# Patient Record
Sex: Female | Born: 1955 | Race: White | Hispanic: No | Smoking: Never smoker
Health system: Southern US, Community
[De-identification: ages and names within clinical notes are randomized; demographics above are authoritative.]

## PROBLEM LIST (undated history)

## (undated) DIAGNOSIS — T7840XA Allergy, unspecified, initial encounter: Secondary | ICD-10-CM

## (undated) DIAGNOSIS — M7711 Lateral epicondylitis, right elbow: Secondary | ICD-10-CM

## (undated) DIAGNOSIS — E782 Mixed hyperlipidemia: Secondary | ICD-10-CM

## (undated) DIAGNOSIS — G8929 Other chronic pain: Secondary | ICD-10-CM

## (undated) DIAGNOSIS — M542 Cervicalgia: Secondary | ICD-10-CM

## (undated) DIAGNOSIS — K649 Unspecified hemorrhoids: Secondary | ICD-10-CM

## (undated) DIAGNOSIS — K219 Gastro-esophageal reflux disease without esophagitis: Secondary | ICD-10-CM

## (undated) DIAGNOSIS — F411 Generalized anxiety disorder: Secondary | ICD-10-CM

## (undated) DIAGNOSIS — F32A Depression, unspecified: Secondary | ICD-10-CM

## (undated) DIAGNOSIS — I10 Essential (primary) hypertension: Secondary | ICD-10-CM

## (undated) DIAGNOSIS — M47812 Spondylosis without myelopathy or radiculopathy, cervical region: Secondary | ICD-10-CM

## (undated) DIAGNOSIS — R911 Solitary pulmonary nodule: Secondary | ICD-10-CM

## (undated) HISTORY — DX: Depression, unspecified: F32.A

## (undated) HISTORY — DX: Other chronic pain: G89.29

## (undated) HISTORY — DX: Essential (primary) hypertension: I10

## (undated) HISTORY — DX: Spondylosis without myelopathy or radiculopathy, cervical region: M47.812

## (undated) HISTORY — DX: Allergy, unspecified, initial encounter: T78.40XA

## (undated) HISTORY — PX: HERNIA REPAIR: SHX51

## (undated) HISTORY — DX: Cervicalgia: M54.2

## (undated) HISTORY — DX: Generalized anxiety disorder: F41.1

## (undated) HISTORY — DX: Lateral epicondylitis, right elbow: M77.11

## (undated) HISTORY — DX: Solitary pulmonary nodule: R91.1

## (undated) HISTORY — DX: Gastro-esophageal reflux disease without esophagitis: K21.9

## (undated) HISTORY — DX: Mixed hyperlipidemia: E78.2

## (undated) HISTORY — DX: Unspecified hemorrhoids: K64.9

---
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
## 1979-11-24 HISTORY — PX: KNEE SURGERY: SHX244

## 1995-11-24 HISTORY — PX: TOTAL ABDOMINAL HYSTERECTOMY W/ BILATERAL SALPINGOOPHORECTOMY: SHX83

## 2002-02-06 ENCOUNTER — Encounter: Payer: Self-pay | Admitting: Emergency Medicine

## 2002-02-07 ENCOUNTER — Observation Stay (HOSPITAL_COMMUNITY): Admission: EM | Admit: 2002-02-07 | Discharge: 2002-02-08 | Payer: Self-pay | Admitting: Emergency Medicine

## 2002-05-17 ENCOUNTER — Ambulatory Visit (HOSPITAL_COMMUNITY): Admission: RE | Admit: 2002-05-17 | Discharge: 2002-05-17 | Payer: Self-pay | Admitting: Family Medicine

## 2002-05-17 ENCOUNTER — Encounter: Payer: Self-pay | Admitting: Family Medicine

## 2003-02-09 ENCOUNTER — Encounter: Admission: RE | Admit: 2003-02-09 | Discharge: 2003-02-09 | Payer: Self-pay | Admitting: Family Medicine

## 2003-02-09 ENCOUNTER — Encounter: Payer: Self-pay | Admitting: Family Medicine

## 2003-06-21 IMAGING — CT CT CHEST W/O CM
1 of 2 series · 14 of 30 positions shown, 18 images · non-contrast
Comparison: none

FINDINGS
CLINICAL DATA: FOLLOW-UP RIGHT UPPER LOBE NODULE.
CT CHEST WITHOUT CONTRAST - 02/09/03
5 MM SPIRAL IMAGES WERE OBTAINED THROUGH THE CHEST WITHOUT CONTRAST.
WITHIN THE MEDIASTINUM, THERE ARE NO ABNORMALLY ENLARGED LYMPH NODES.  THE VASCULATURE IS GROSSLY
WITHIN NORMAL LIMITS.  THE PERIPHERAL PLEURAL NODULE IS AGAIN NOTED IN THE RIGHT UPPER LOBE ON
IMAGE 18 AND IS NOT CHANGED.  THE SMALL NODULE IN THE RIGHT LOWER LOBE ADJACENT TO THE MAJOR
FISSURE IS UNCHANGED AND SEEN ON IMAGE 24 AND 25.  NO NEW NODULES ARE IDENTIFIED.
NO PNEUMOTHORACES OR EFFUSIONS ARE SEEN.
IMPRESSION
STABLE NODULES IN THE RIGHT LUNG, AS DESCRIBED.  FOLLOW-UP IN SIX MONTHS IS RECOMMENDED TO INSURE
STABILITY.

[Series 2: routine chest · axial · 0.54mm/px · z∈[-237,-7]mm · 14 of 54 slices shown, 18 images]
[im 4/54  mediastinal]
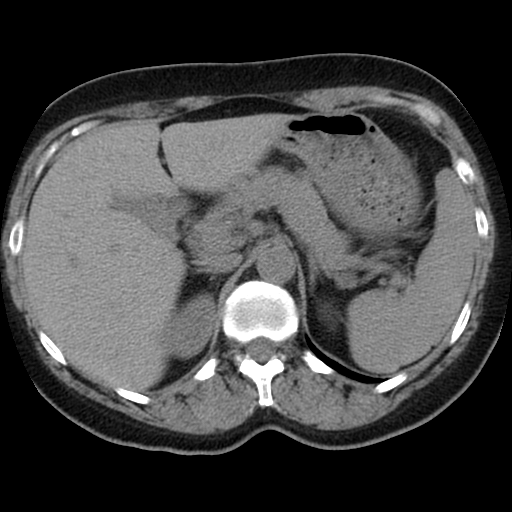
[im 4/54  lung]
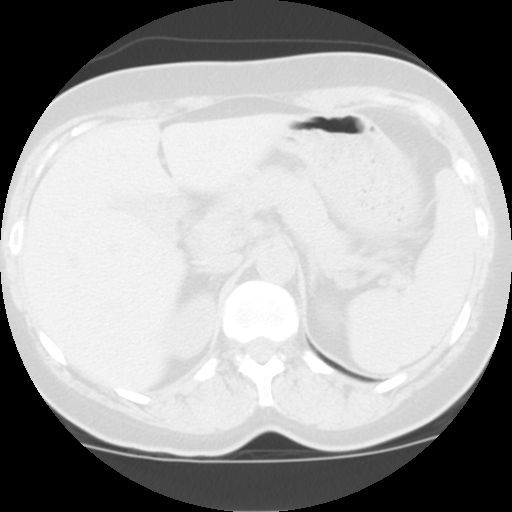
[im 8/54  lung]
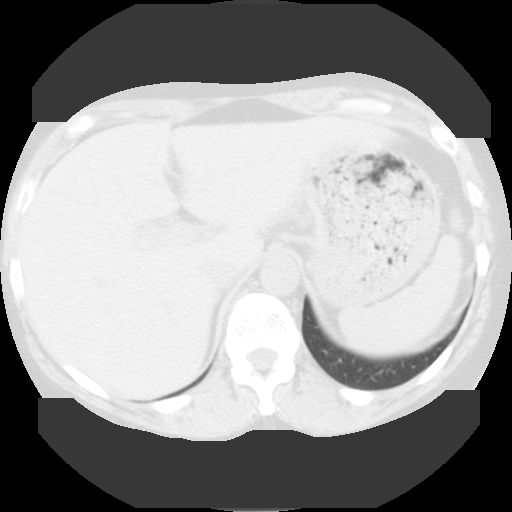
[im 12/54  lung]
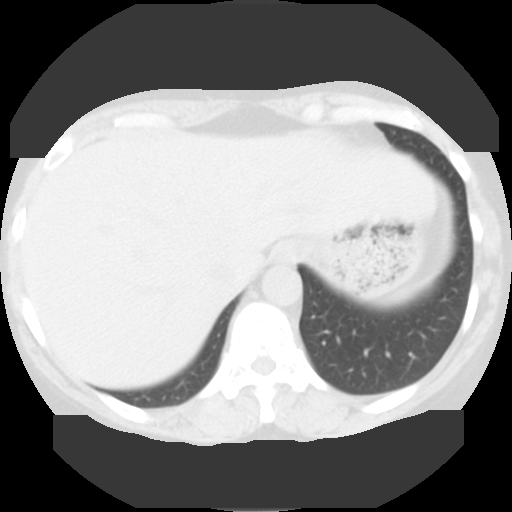
[im 16/54  lung]
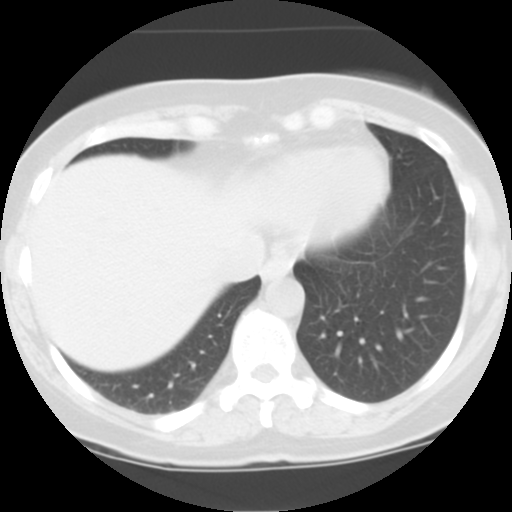
[im 19/54  mediastinal]
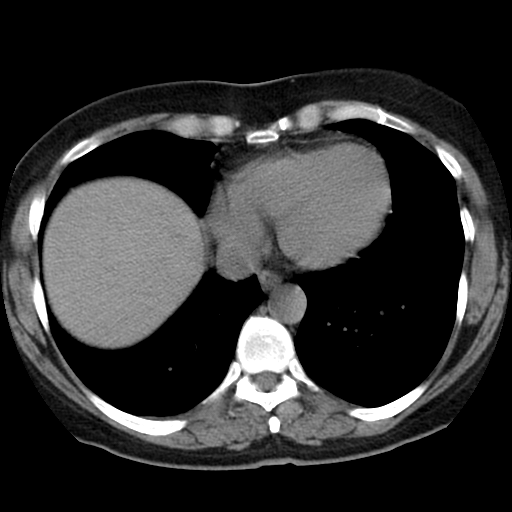
[im 19/54  lung]
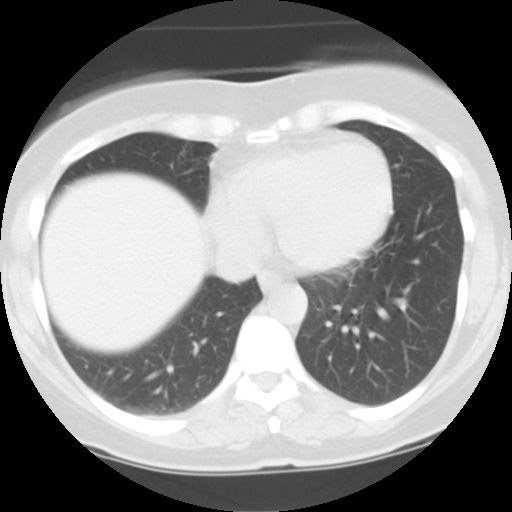
[im 23/54  lung]
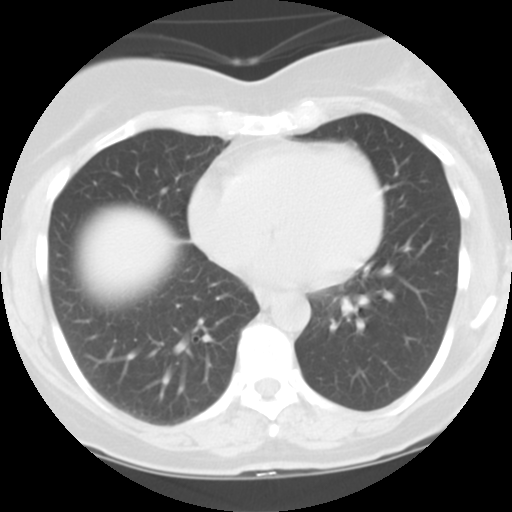
[im 26/54  lung]
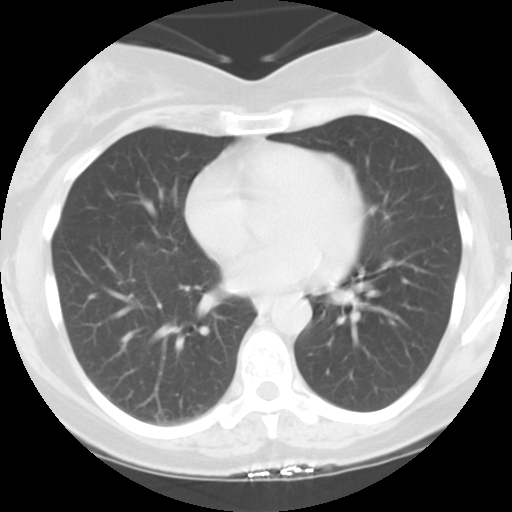
[im 27/54  lung]
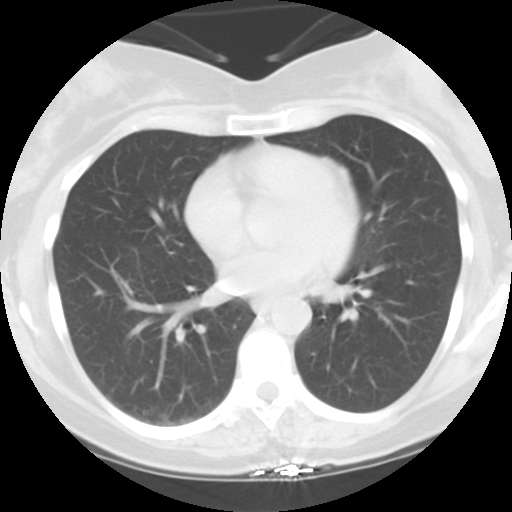
[im 31/54  mediastinal]
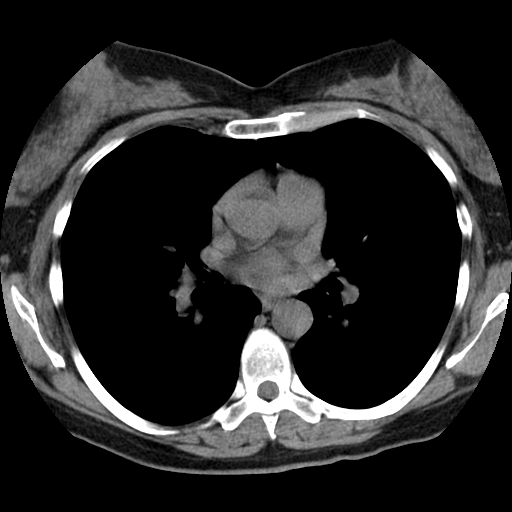
[im 31/54  lung]
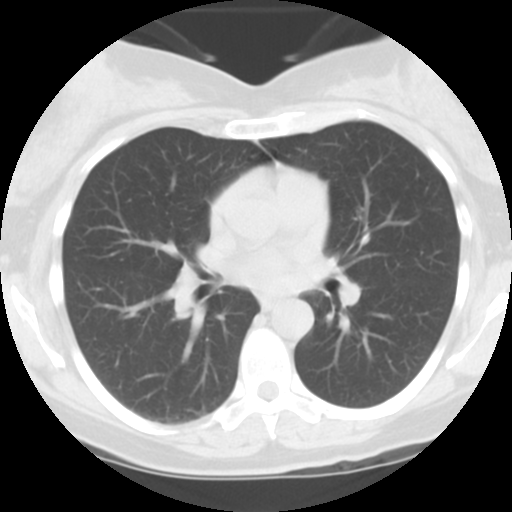
[im 35/54  lung]
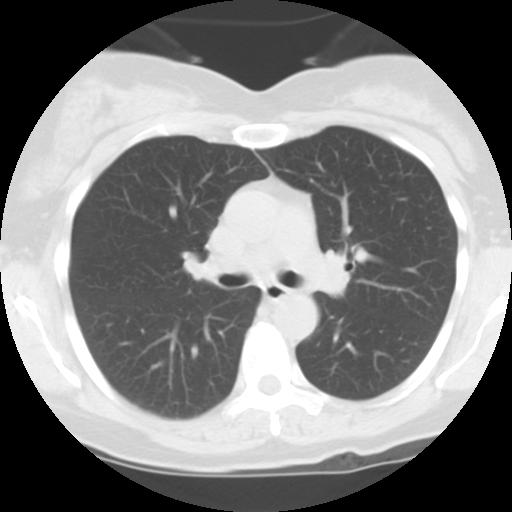
[im 38/54  lung]
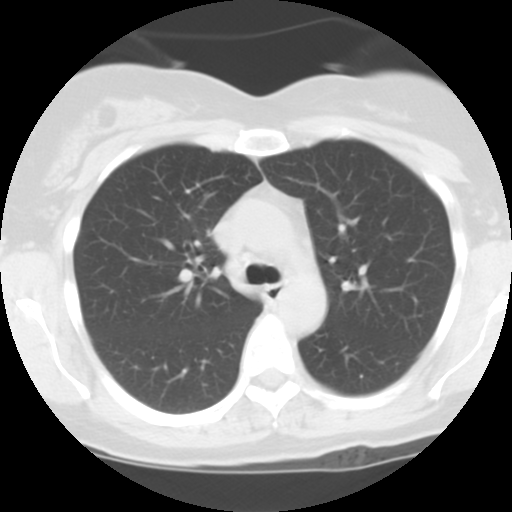
[im 42/54  lung]
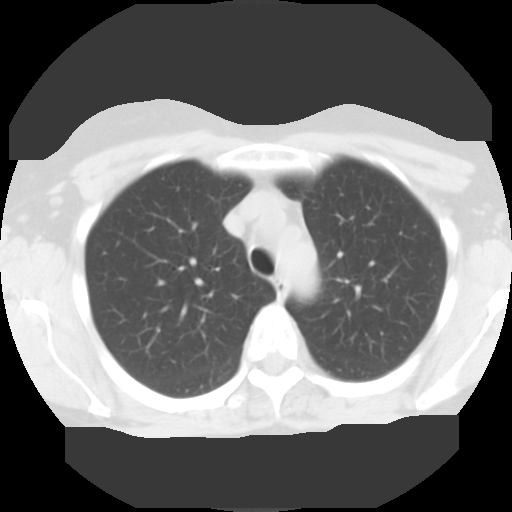
[im 46/54  mediastinal]
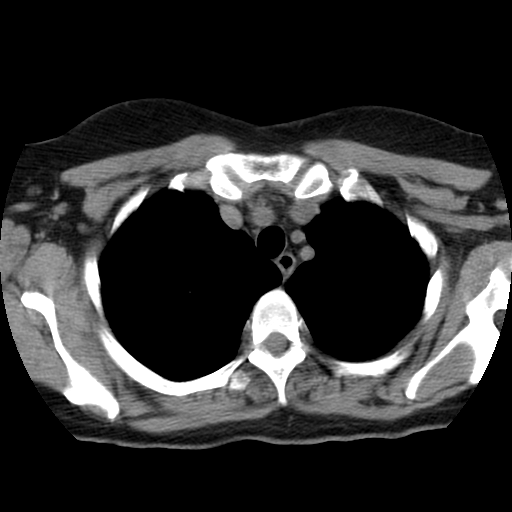
[im 46/54  lung]
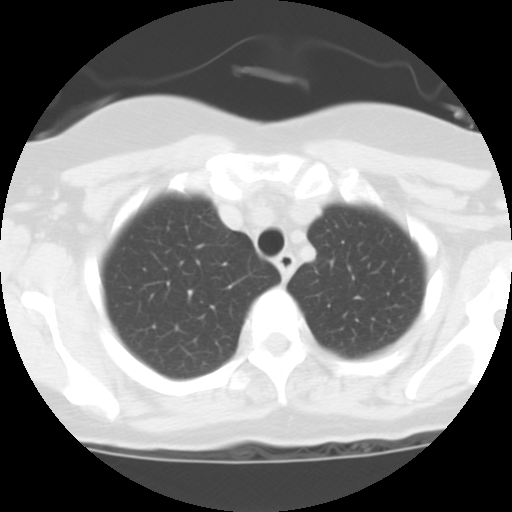
[im 50/54  lung]
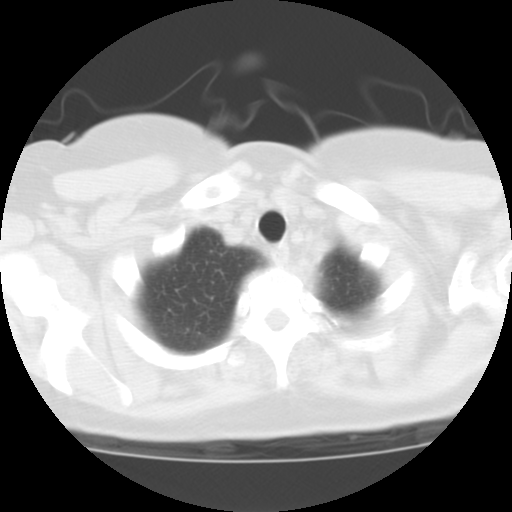

[14 of 30 positions shown; findings below may reference images not displayed]

## 2006-02-17 ENCOUNTER — Ambulatory Visit (HOSPITAL_COMMUNITY)
Admission: RE | Admit: 2006-02-17 | Discharge: 2006-02-17 | Payer: No Typology Code available for payment source | Admitting: Family Medicine

## 2007-02-24 ENCOUNTER — Emergency Department (HOSPITAL_COMMUNITY): Admission: EM | Admit: 2007-02-24 | Discharge: 2007-02-25 | Payer: Self-pay | Admitting: Emergency Medicine

## 2007-03-01 ENCOUNTER — Encounter: Admission: RE | Admit: 2007-03-01 | Discharge: 2007-03-01 | Payer: Self-pay | Admitting: Family Medicine

## 2007-11-24 HISTORY — PX: COLONOSCOPY: SHX174

## 2007-12-25 DIAGNOSIS — M47812 Spondylosis without myelopathy or radiculopathy, cervical region: Secondary | ICD-10-CM

## 2007-12-25 DIAGNOSIS — M7711 Lateral epicondylitis, right elbow: Secondary | ICD-10-CM

## 2007-12-25 HISTORY — DX: Spondylosis without myelopathy or radiculopathy, cervical region: M47.812

## 2007-12-25 HISTORY — DX: Lateral epicondylitis, right elbow: M77.11

## 2008-11-23 DIAGNOSIS — E782 Mixed hyperlipidemia: Secondary | ICD-10-CM

## 2008-11-23 HISTORY — DX: Mixed hyperlipidemia: E78.2

## 2009-11-23 DIAGNOSIS — F411 Generalized anxiety disorder: Secondary | ICD-10-CM

## 2009-11-23 HISTORY — DX: Generalized anxiety disorder: F41.1

## 2009-12-09 ENCOUNTER — Encounter
Admission: RE | Admit: 2009-12-09 | Discharge: 2009-12-09 | Payer: Self-pay | Admitting: Physical Medicine & Rehabilitation

## 2010-01-06 ENCOUNTER — Emergency Department (HOSPITAL_COMMUNITY): Admission: EM | Admit: 2010-01-06 | Discharge: 2010-01-07 | Payer: Self-pay | Admitting: Emergency Medicine

## 2010-12-14 ENCOUNTER — Encounter: Payer: Self-pay | Admitting: Family Medicine

## 2011-02-11 LAB — DIFFERENTIAL
Basophils Absolute: 0 10*3/uL (ref 0.0–0.1)
Basophils Relative: 0 % (ref 0–1)
Eosinophils Absolute: 0.1 10*3/uL (ref 0.0–0.7)
Eosinophils Relative: 1 % (ref 0–5)
Lymphocytes Relative: 16 % (ref 12–46)
Lymphs Abs: 1.4 10*3/uL (ref 0.7–4.0)
Monocytes Absolute: 0.5 10*3/uL (ref 0.1–1.0)
Neutrophils Relative %: 78 % — ABNORMAL HIGH (ref 43–77)

## 2011-02-11 LAB — CBC: MCHC: 34.1 g/dL (ref 30.0–36.0)

## 2011-02-11 LAB — BASIC METABOLIC PANEL
CO2: 26 mEq/L (ref 19–32)
Calcium: 9.1 mg/dL (ref 8.4–10.5)
GFR calc non Af Amer: >60 mL/min (ref >60)
Glucose, Bld: 142 mg/dL — ABNORMAL HIGH (ref 70–99)
Sodium: 135 mEq/L (ref 135–145)

## 2011-02-11 LAB — POCT CARDIAC MARKERS: CKMB, poc: <1 ng/mL — ABNORMAL LOW (ref 1.0–8.0)

## 2011-04-10 NOTE — Consult Note (Signed)
[place]  Patient:    Annette Paul Visit Number: [id number] MRN: [id number]          Service Type: MED Location: [phone] Attending Physician:  Anastasio Auerbach Dictated by:   Meade Maw, M.D. Admit Date:  02/06/2002   CC:         Anastasio Auerbach, M.D.  Marinda Elk, M.D.   Consultation Report  REFERRING PHYSICIAN:  Anastasio Auerbach, M.D.  INDICATION FOR CONSULTATION:  Chest pain.  HISTORY:  Annette Paul is a 55 year old female, who presented to the hospital on February 07, 2002 with complaints of ongoing severe chest pain, described as a 10/10 upon admission.  Currently described as a 2/10.  The pain was initially described as sharp radiating to her neck and was associated with mild nausea and diaphoresis.  She had a similar episode of chest pain approximately one month prior.  This was evaluated by her family physician and was felt to be secondary to anxiety.  Her coronary risk factors include postmenopausal status, family history - grandmother passed at 59 from myocardial infarction. Her cholesterol status is unknown.  There is no history of hypertension or diabetes.  PAST MEDICAL HISTORY: 1. Significant for anxiety. 2. Mitral valve prolapse diagnosed at age 16 by Dr. Patty Sermons with a 2-D    echocardiogram. 3. Chronic cough  PAST SURGICAL HISTORY: 1. Significant for hysterectomy with bilateral salpingo-oophorectomy in 1997. 2. Right knee surgery in 1980. 3. Hernia repair.  ALLERGIES:  SULFA, which results in a rash.  CURRENT MEDICATIONS: 1. Protonix 40 mg daily. 2. Aspirin 81 mg daily. 3. Xanax 0.25 p.o. q.8 p.r.n. for anxiety.  FAMILY HISTORY:  As noted above.  Significant for grandmother passing at age 39 with myocardial infarction.  Two aunts and uncles with CVA.  SOCIAL HISTORY:  She is married.  She has been married for 11 years.  She has a 6-year-old son, who has juvenile onset diabetes.  This creates significant stressors for  her.  She also describes work-related stress.  She states, that her marriage is good.  REVIEW OF SYSTEMS:  She notes frequent headaches, and weight gain.  She has had a dry cough for several months.  No change in bowel or bladder habits.  No blood noted in the stools.  Occasional heart racing and significant anxiety.  PHYSICAL EXAMINATION:  GENERAL:  A middle-aged female, somewhat stressed.  VITALS:  Blood pressure is 120/62, heart rate is 74.  She is afebrile.  Her O2 saturation is 97% on room air.  HEENT:  Remarkable.  She has good carotid upstrokes, no carotid bruits. Thyroid is not palpable.   No neck vein distension.  PULMONARY EXAMINATION:  Revealed breath sounds, which initially has expiratory rhonchi, but clears with coughing.  No use of accessory muscles.  CARDIOVASCULAR EXAMINATION:  Revealed a normal S1, normal S2.  There is a mid systolic click noted.  There is no murmur noted on physical examination, however, the environmental noise is loud.  Her PMI is within normal limits.  ABDOMEN:  Soft.  There is mild tenderness to deep palpations.  There is no unusual bruits or pulsations noted.  EXTREMITIES:  Revealed no peripheral edema.  Her distal pulses are equal and 2+.  Motor is 5/5 throughout.  NEURO:  Nonfocal.  LABORATORY DATA:  Her ECG revealed a normal sinus rhythm, rate of 74, normal ECG.  Her serial cardiac enzymes are negative with a CK of 55, troponin I is 0.03.  Amylase and lipase are within normal limits.  IMPRESSION: 1. CHEST PAIN WITH ATYPICAL AND TYPICAL FEATURES.  She has low-risk index as    her cardiac enzymes and initial ECG are normal.  We will schedule for a    stress Cardiolite for further evaluation.  Differential diagnosis to    include costal chondritis, pain associated with mitral valve prolapse, and    pulmonary etiology.   Currently, her CT scan of the chest is pending. 2. MITRAL VALVE PROLAPSE.  There is a mid systolic click heard on  examination,    but no evidence of a murmur.  As she has been instructed by Dr. Patty Sermons    in the past on bacterial endocarditis prophylaxis, would continue with this    mode of treatment.  Agree with your current treatment of aspirin and as she    has low-risk indicators would not initiate anticoagulants at this time. 3. GERD.  Agree with continuation of her PPIs. 4. HYPOKALEMIA.  Potassium noted to be 3.3 upon admission.  This has been    scheduled to be repeated.  Further recommendations pending the outcome of    her stress Cardiolite.  Thank you for allowing me to participate in the care of your patient.  I will discuss the patient further with you. Dictated by:   Meade Maw, M.D. Attending Physician:  Anastasio Auerbach DD:  02/07/02 TD:  02/08/02 Job: [id number]

## 2011-07-09 ENCOUNTER — Emergency Department (HOSPITAL_COMMUNITY)
Admission: EM | Admit: 2011-07-09 | Discharge: 2011-07-09 | Disposition: A | Discharge status: Home / Self Care | Payer: PRIVATE HEALTH INSURANCE | Attending: Emergency Medicine | Admitting: Emergency Medicine

## 2011-07-09 ENCOUNTER — Emergency Department (HOSPITAL_COMMUNITY): Payer: PRIVATE HEALTH INSURANCE

## 2011-07-09 DIAGNOSIS — R1013 Epigastric pain: Secondary | ICD-10-CM | POA: Insufficient documentation

## 2011-07-09 DIAGNOSIS — R112 Nausea with vomiting, unspecified: Secondary | ICD-10-CM | POA: Insufficient documentation

## 2011-07-09 DIAGNOSIS — K3189 Other diseases of stomach and duodenum: Secondary | ICD-10-CM | POA: Insufficient documentation

## 2011-07-09 DIAGNOSIS — R079 Chest pain, unspecified: Secondary | ICD-10-CM | POA: Insufficient documentation

## 2011-07-09 DIAGNOSIS — R0609 Other forms of dyspnea: Secondary | ICD-10-CM | POA: Insufficient documentation

## 2011-07-09 DIAGNOSIS — R0989 Other specified symptoms and signs involving the circulatory and respiratory systems: Secondary | ICD-10-CM | POA: Insufficient documentation

## 2011-07-09 DIAGNOSIS — I059 Rheumatic mitral valve disease, unspecified: Secondary | ICD-10-CM | POA: Insufficient documentation

## 2011-07-09 LAB — CBC
MCHC: 34 g/dL (ref 30.0–36.0)
MCV: 89.1 fL (ref 78.0–100.0)
Platelets: 297 10*3/uL (ref 150–400)
WBC: 5.4 10*3/uL (ref 4.0–10.5)

## 2011-07-09 LAB — POCT I-STAT TROPONIN I: Troponin i, poc: 0 ng/mL (ref 0.00–0.08)

## 2011-07-09 LAB — POCT I-STAT, CHEM 8
Chloride: 106 mEq/L (ref 96–112)
Potassium: 3.9 mEq/L (ref 3.5–5.1)
Sodium: 140 mEq/L (ref 135–145)
TCO2: 27 mmol/L (ref 0–100)

## 2011-07-09 LAB — DIFFERENTIAL
Basophils Absolute: 0 10*3/uL (ref 0.0–0.1)
Eosinophils Absolute: 0.1 10*3/uL (ref 0.0–0.7)
Lymphs Abs: 1.6 10*3/uL (ref 0.7–4.0)
Monocytes Relative: 7 % (ref 3–12)
Neutro Abs: 3.2 10*3/uL (ref 1.7–7.7)

## 2011-09-14 ENCOUNTER — Encounter: Payer: Self-pay | Admitting: Family Medicine

## 2011-09-14 ENCOUNTER — Other Ambulatory Visit: Payer: Self-pay | Admitting: Family Medicine

## 2011-09-14 ENCOUNTER — Telehealth: Payer: Self-pay | Admitting: Family Medicine

## 2011-09-14 ENCOUNTER — Ambulatory Visit (INDEPENDENT_AMBULATORY_CARE_PROVIDER_SITE_OTHER)
Admission: RE | Admit: 2011-09-14 | Discharge: 2011-09-14 | Disposition: A | Discharge status: Home / Self Care | Payer: PRIVATE HEALTH INSURANCE | Source: Ambulatory Visit | Attending: Family Medicine | Admitting: Family Medicine

## 2011-09-14 ENCOUNTER — Ambulatory Visit (INDEPENDENT_AMBULATORY_CARE_PROVIDER_SITE_OTHER): Payer: PRIVATE HEALTH INSURANCE | Admitting: Family Medicine

## 2011-09-14 ENCOUNTER — Ambulatory Visit (HOSPITAL_BASED_OUTPATIENT_CLINIC_OR_DEPARTMENT_OTHER)
Admission: RE | Admit: 2011-09-14 | Discharge: 2011-09-14 | Disposition: A | Discharge status: Home / Self Care | Payer: PRIVATE HEALTH INSURANCE | Source: Ambulatory Visit | Attending: Family Medicine | Admitting: Family Medicine

## 2011-09-14 DIAGNOSIS — M79609 Pain in unspecified limb: Secondary | ICD-10-CM

## 2011-09-14 DIAGNOSIS — Z1231 Encounter for screening mammogram for malignant neoplasm of breast: Secondary | ICD-10-CM

## 2011-09-14 DIAGNOSIS — Z1239 Encounter for other screening for malignant neoplasm of breast: Secondary | ICD-10-CM

## 2011-09-14 DIAGNOSIS — M79644 Pain in right finger(s): Secondary | ICD-10-CM

## 2011-09-14 DIAGNOSIS — M542 Cervicalgia: Secondary | ICD-10-CM

## 2011-09-14 DIAGNOSIS — G8929 Other chronic pain: Secondary | ICD-10-CM

## 2011-09-14 DIAGNOSIS — M19049 Primary osteoarthritis, unspecified hand: Secondary | ICD-10-CM | POA: Insufficient documentation

## 2011-09-14 DIAGNOSIS — M25519 Pain in unspecified shoulder: Secondary | ICD-10-CM

## 2011-09-14 DIAGNOSIS — J069 Acute upper respiratory infection, unspecified: Secondary | ICD-10-CM

## 2011-09-14 MED ORDER — CARISOPRODOL 250 MG PO TABS: ORAL_TABLET | ORAL | Status: DC

## 2011-09-14 MED ORDER — HYDROCODONE-ACETAMINOPHEN 5-500 MG PO TABS: 1.0000 | ORAL_TABLET | Freq: Four times a day (QID) | ORAL | Status: AC | PRN
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
## 2011-09-14 NOTE — Telephone Encounter (Signed)
I forgot to attach a result note to her x-rays today, so I'm sending you a callback as a telephone note: please call pt and tell her that her x-rays of her  Hand showed only mild degenerative arthritis in her finger and her neck showed no significant abnormality.  This is good. Continue with plan as we outlined today at her office visit: pain control with vicodin and soma, NSAID like ibuprofen or alleve as tolerated like she has been doing, and I will do an injection of her finger when she returns for f/u in 2 wks. She can schedule this injection with me earlier if she wants to.  --PM

## 2011-09-14 NOTE — Telephone Encounter (Signed)
Pls request records from [facility] at OR.  thx-PM.

## 2011-09-14 NOTE — Telephone Encounter (Signed)
Done

## 2011-09-14 NOTE — Assessment & Plan Note (Signed)
Suspect DDD with spinal nerve root irritation/compression on left. Will check C-spine plain films, obtain old records, continue bid alleve or ibuprofen, refer to PT. Vicodin 5/500 and soma 250mg  rx'd today for prn use for comfort. Therapeutic expectations and side effect profile of medication discussed today.  Patient's questions answered. If no improvement with conservative mgmt, will proceed with c-spine MRI +/- neurosurgery referral.

## 2011-09-14 NOTE — Assessment & Plan Note (Signed)
Trigger finger, 3rd digit on right hand.  Possibly some osteoarthritis as well. Will check plain film of right hand, continue bid NSAID as she is doing. At recheck in 2 wks we can offer steroid injection of this.

## 2011-09-14 NOTE — Progress Notes (Signed)
Addended by: Andrew Au on: 09/14/2011 03:58 PM   Modules accepted: Orders

## 2011-09-14 NOTE — Assessment & Plan Note (Signed)
Discussed symptomatic care. Suggested mucinex dm prn.

## 2011-09-16 ENCOUNTER — Other Ambulatory Visit: Payer: Self-pay | Admitting: Family Medicine

## 2011-09-16 MED ORDER — FLUOXETINE HCL 20 MG PO CAPS: 20.0000 mg | ORAL_CAPSULE | Freq: Every day | ORAL | Status: DC

## 2011-09-16 NOTE — Telephone Encounter (Signed)
I have attempted to contact this patient by phone with the following results: left message to return my call on answering machine (home/mobile).  

## 2011-09-16 NOTE — Telephone Encounter (Signed)
RC to pt.  She has appt for injection tomorrow.  Notified of xray results.  She is agreeable.  She states that Annette Paul was too expensive and she did not pick up that med.  She would like something cheaper.  Advised pt Dr. Milinda Cave out of office this PM, but he will get this message tomorrow.  She is agreeable.

## 2011-09-16 NOTE — Telephone Encounter (Signed)
Pt would like you to RX her fluoxetine.  She has been out for awhile.  She states this was discussed at her visit.  Is this OK?  I will notify patient.  Thanks.

## 2011-09-16 NOTE — Telephone Encounter (Signed)
Yes.  I sent eRx for fluoxetine just now--PM

## 2011-09-17 ENCOUNTER — Encounter: Payer: Self-pay | Admitting: Family Medicine

## 2011-09-17 ENCOUNTER — Ambulatory Visit (INDEPENDENT_AMBULATORY_CARE_PROVIDER_SITE_OTHER): Payer: PRIVATE HEALTH INSURANCE | Admitting: Family Medicine

## 2011-09-17 VITALS — BP 136/87 | HR 72 | Temp 98.6°F | Wt 150.0 lb

## 2011-09-17 DIAGNOSIS — M79609 Pain in unspecified limb: Secondary | ICD-10-CM

## 2011-09-17 DIAGNOSIS — M79644 Pain in right finger(s): Secondary | ICD-10-CM

## 2011-09-17 MED ORDER — CARISOPRODOL 350 MG PO TABS: 350.0000 mg | ORAL_TABLET | Freq: Four times a day (QID) | ORAL | Status: DC | PRN

## 2011-09-17 NOTE — Assessment & Plan Note (Addendum)
Trigger finger, right middle finger. Discussed options, discussed pros/cons of injection.  Pt wished to proceed with tendonous sheath injection today. Injected 1/4 cc 80mg /cc depomedrol + 1/2 cc of 1% lidocaine w/out epi today without problem.   Location of injection was 1/2 cm before the proximal volar crease on the 3rd digit of right hand.  Inserted needle perpendicular to skin and pushed slowly to the point of abutting the tendon (about 3/8").  I then pulled back about 1mm and injected.  No resistance to injection.   Pt tolerated procedure well, no immediate complications. Buddy tape x 3d, then minimal flexion/extension of the digit for 3 additional wks.

## 2011-09-17 NOTE — Progress Notes (Signed)
OFFICE NOTE  09/17/2011  CC:  Chief Complaint  Patient presents with  . Injections    joint injection in finger     HPI: Patient is a 55 y.o. Caucasian female who is here for injection of trigger finger on right hand.  Has had > 68mo of middle finger pain focused mainly at MCP, prox phalanx, and PIP areas on volar aspect, particularly when squeezed or when she flexes.  Stiff/poor tracking, some rare sticking in flexed position.  X-ray of right hand a few days ago was unremarkable.  Pertinent PMH:  Chronic neck pain Anxiety/depression  Pertinent Meds: Fluoxetine 20mg  qd, vicodin 5/500 prn, ibuprofen/alleve prn  PE: Blood pressure 136/87, pulse 72, temperature 98.6 F (37 C), temperature source Oral, weight 150 lb (68.04 kg), SpO2 94.00%. Right hand without swelling, erythema, or warmth.  Mild TTP on volar aspect of 3rd digit from metacarpal head region into PIP region.  ROM limited in flexion secondary to pain and stiffness.    IMPRESSION AND PLAN:  Finger pain, right Trigger finger, right middle finger. Discussed options, discussed pros/cons of injection.  Pt wished to proceed with tendonous sheath injection today. Injected 1/4 cc 80mg /cc depomedrol + 1/2 cc of 1% lidocaine w/out epi today without problem.   Location of injection was 1/2 cm before the proximal volar crease on the 3rd digit of right hand.  Inserted needle perpendicular to skin and pushed slowly to the point of abutting the tendon (about 3/8").  I then pulled back about 1mm and injected.  No resistance to injection.   Pt tolerated procedure well, no immediate complications. Buddy tape x 3d, then minimal flexion/extension of the digit for 3 additional wks.      FOLLOW UP: prn (she has f/u appt already set).

## 2011-09-24 ENCOUNTER — Ambulatory Visit: Payer: PRIVATE HEALTH INSURANCE | Admitting: Family Medicine

## 2011-10-01 ENCOUNTER — Encounter: Payer: Self-pay | Admitting: Family Medicine

## 2011-10-02 ENCOUNTER — Ambulatory Visit: Payer: PRIVATE HEALTH INSURANCE | Admitting: Family Medicine

## 2011-11-18 ENCOUNTER — Ambulatory Visit (HOSPITAL_COMMUNITY): Payer: PRIVATE HEALTH INSURANCE | Attending: Family Medicine

## 2011-12-07 ENCOUNTER — Encounter (HOSPITAL_BASED_OUTPATIENT_CLINIC_OR_DEPARTMENT_OTHER): Payer: Self-pay | Admitting: *Deleted

## 2011-12-07 ENCOUNTER — Emergency Department (INDEPENDENT_AMBULATORY_CARE_PROVIDER_SITE_OTHER): Payer: PRIVATE HEALTH INSURANCE

## 2011-12-07 ENCOUNTER — Emergency Department (HOSPITAL_BASED_OUTPATIENT_CLINIC_OR_DEPARTMENT_OTHER)
Admission: EM | Admit: 2011-12-07 | Discharge: 2011-12-07 | Disposition: A | Discharge status: Home / Self Care | Payer: PRIVATE HEALTH INSURANCE | Attending: Emergency Medicine | Admitting: Emergency Medicine

## 2011-12-07 DIAGNOSIS — J9819 Other pulmonary collapse: Secondary | ICD-10-CM

## 2011-12-07 DIAGNOSIS — J45909 Unspecified asthma, uncomplicated: Secondary | ICD-10-CM | POA: Insufficient documentation

## 2011-12-07 DIAGNOSIS — Z79899 Other long term (current) drug therapy: Secondary | ICD-10-CM | POA: Insufficient documentation

## 2011-12-07 DIAGNOSIS — J40 Bronchitis, not specified as acute or chronic: Secondary | ICD-10-CM | POA: Insufficient documentation

## 2011-12-07 DIAGNOSIS — R0602 Shortness of breath: Secondary | ICD-10-CM | POA: Insufficient documentation

## 2011-12-07 DIAGNOSIS — K219 Gastro-esophageal reflux disease without esophagitis: Secondary | ICD-10-CM | POA: Insufficient documentation

## 2011-12-07 DIAGNOSIS — E785 Hyperlipidemia, unspecified: Secondary | ICD-10-CM | POA: Insufficient documentation

## 2011-12-07 DIAGNOSIS — R0989 Other specified symptoms and signs involving the circulatory and respiratory systems: Secondary | ICD-10-CM

## 2011-12-07 DIAGNOSIS — R05 Cough: Secondary | ICD-10-CM

## 2011-12-07 DIAGNOSIS — I10 Essential (primary) hypertension: Secondary | ICD-10-CM | POA: Insufficient documentation

## 2011-12-07 DIAGNOSIS — G8929 Other chronic pain: Secondary | ICD-10-CM | POA: Insufficient documentation

## 2011-12-07 DIAGNOSIS — R059 Cough, unspecified: Secondary | ICD-10-CM

## 2011-12-07 MED ORDER — ALBUTEROL SULFATE HFA 108 (90 BASE) MCG/ACT IN AERS
2.0000 | INHALATION_SPRAY | Freq: Once | RESPIRATORY_TRACT | Status: AC
  Administered 2011-12-07: 2 via RESPIRATORY_TRACT

## 2011-12-07 MED ORDER — BENZONATATE 100 MG PO CAPS
200.0000 mg | ORAL_CAPSULE | Freq: Once | ORAL | Status: AC
  Administered 2011-12-07: 200 mg via ORAL
  Filled 2011-12-07: qty 2

## 2011-12-07 MED ORDER — METHYLPREDNISOLONE SODIUM SUCC 125 MG IJ SOLR
INTRAMUSCULAR | Status: AC
  Filled 2011-12-07: qty 2

## 2011-12-07 MED ORDER — AEROCHAMBER PLUS W/MASK LARGE MISC
1.0000 | Freq: Once | Status: AC
  Administered 2011-12-07: 1
  Filled 2011-12-07: qty 1

## 2011-12-07 MED ORDER — ALBUTEROL SULFATE (5 MG/ML) 0.5% IN NEBU
INHALATION_SOLUTION | RESPIRATORY_TRACT | Status: AC
  Administered 2011-12-07: 21:00:00
  Filled 2011-12-07: qty 2

## 2011-12-07 MED ORDER — PREDNISONE 50 MG PO TABS: 60.0000 mg | ORAL_TABLET | Freq: Once | ORAL | Status: DC

## 2011-12-07 MED ORDER — IPRATROPIUM BROMIDE 0.02 % IN SOLN
RESPIRATORY_TRACT | Status: AC
  Administered 2011-12-07: 21:00:00
  Filled 2011-12-07: qty 2.5

## 2011-12-07 MED ORDER — HYDROCOD POLST-CHLORPHEN POLST 10-8 MG/5ML PO LQCR: 5.0000 mL | Freq: Every evening | ORAL | Status: DC | PRN

## 2011-12-07 MED ORDER — BENZONATATE 200 MG PO CAPS: 200.0000 mg | ORAL_CAPSULE | Freq: Three times a day (TID) | ORAL | Status: AC | PRN

## 2011-12-07 MED ORDER — ALBUTEROL SULFATE HFA 108 (90 BASE) MCG/ACT IN AERS
INHALATION_SPRAY | RESPIRATORY_TRACT | Status: AC
  Administered 2011-12-07: 2 via RESPIRATORY_TRACT
  Filled 2011-12-07: qty 6.7

## 2011-12-07 MED ORDER — PREDNISONE 20 MG PO TABS: 60.0000 mg | ORAL_TABLET | Freq: Every day | ORAL | Status: DC

## 2011-12-07 NOTE — Patient Instructions (Signed)
Instructed pt on the proper use of administering albuteral mdi via aerochamber pt tolerated well 

## 2011-12-07 NOTE — ED Notes (Signed)
Brought in by EMS  For increased SOB with dx bronchitis
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
## 2011-12-07 NOTE — ED Notes (Signed)
Solumedrol given by ems

## 2012-11-30 ENCOUNTER — Ambulatory Visit (INDEPENDENT_AMBULATORY_CARE_PROVIDER_SITE_OTHER): Payer: No Typology Code available for payment source | Admitting: Family Medicine

## 2012-11-30 ENCOUNTER — Encounter: Payer: Self-pay | Admitting: Family Medicine

## 2012-11-30 VITALS — BP 143/90 | HR 81 | Temp 98.6°F | Ht 62.0 in | Wt 146.0 lb

## 2012-11-30 DIAGNOSIS — K649 Unspecified hemorrhoids: Secondary | ICD-10-CM

## 2012-11-30 DIAGNOSIS — K5900 Constipation, unspecified: Secondary | ICD-10-CM

## 2012-11-30 DIAGNOSIS — K5909 Other constipation: Secondary | ICD-10-CM | POA: Insufficient documentation

## 2012-11-30 NOTE — Assessment & Plan Note (Signed)
Recommended she continue her docusate 200mg  qd and add miralax qd as well as metamucil daily.

## 2012-11-30 NOTE — Assessment & Plan Note (Signed)
Chronic, painful, occasionally bleeding hemorrhoids. She is in daily misery and her topical therapies and soaks are not improving things. Will refer to general surgery to see if she is a candidate for hemorrhoid surgery.
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
## 2012-12-01 ENCOUNTER — Telehealth: Payer: Self-pay | Admitting: *Deleted

## 2012-12-01 MED ORDER — FLUOXETINE HCL 20 MG PO CAPS: 20.0000 mg | ORAL_CAPSULE | Freq: Every day | ORAL | Status: DC

## 2012-12-01 NOTE — Telephone Encounter (Signed)
Notified and is agreeable.  She has appt with [facility] tomorrow.

## 2012-12-01 NOTE — Telephone Encounter (Signed)
Since she has been on this med in the past and did well, a routine 6 mo f/u for med check is fine.  She can come in for f/u earlier if she is worsening despite being on the med or if the med doesn't start to help in 4 wks (remind her that it typically takes 3 wks to begin to help with anxiety/mood symptoms).  Thx.

## 2012-12-01 NOTE — Telephone Encounter (Signed)
Was previously taking fluoxetine, but has not taken recently as her RX ran out and she has not been in to see you.  Last OV prior to yesterday was 09/17/2011.  Pt called today requesting refill on fluoxetine.  Is this OK? Does pt need follow up appt? Please advise

## 2012-12-02 ENCOUNTER — Ambulatory Visit (INDEPENDENT_AMBULATORY_CARE_PROVIDER_SITE_OTHER): Payer: No Typology Code available for payment source | Admitting: General Surgery

## 2012-12-02 ENCOUNTER — Encounter (INDEPENDENT_AMBULATORY_CARE_PROVIDER_SITE_OTHER): Payer: Self-pay | Admitting: General Surgery

## 2012-12-02 VITALS — BP 128/80 | HR 76 | Temp 98.2°F | Resp 14 | Ht 62.0 in | Wt 147.8 lb

## 2012-12-02 DIAGNOSIS — K644 Residual hemorrhoidal skin tags: Secondary | ICD-10-CM

## 2012-12-02 DIAGNOSIS — K648 Other hemorrhoids: Secondary | ICD-10-CM | POA: Insufficient documentation

## 2012-12-02 MED ORDER — HYDROCORTISONE 2.5 % RE CREA: TOPICAL_CREAM | Freq: Two times a day (BID) | RECTAL | Status: DC

## 2012-12-02 NOTE — Patient Instructions (Signed)
Hemorrhoids  Hemorrhoids are enlarged (dilated) veins around the rectum. There are 2 types of hemorrhoids, and the type of hemorrhoid is determined by its location. Internal hemorrhoids occur in the veins just inside the rectum.They are usually not painful, but they may bleed.However, they may poke through to the outside and become irritated and painful. External hemorrhoids involve the veins outside the anus and can be felt as a painful swelling or hard lump near the anus.They are often itchy and may crack and bleed. Sometimes clots will form in the veins. This makes them swollen and painful. These are called thrombosed hemorrhoids. CAUSES Causes of hemorrhoids include:  Pregnancy. This increases the pressure in the hemorrhoidal veins.   Constipation.   Straining to have a bowel movement.   Obesity.   Heavy lifting or other activity that caused you to strain.   You have internal and external hemorrhoids.  One of the internal hemorrhoids is prolapsing.   I will add prescription hemorrhoid cream.  This is to decrease inflammation.   Add Miralax daily.    TREATMENT Most of the time hemorrhoids improve in 1 to 2 weeks. However, if symptoms do not seem to be getting better or if you have a lot of rectal bleeding, your caregiver may perform a procedure to help make the hemorrhoids get smaller or remove them completely.Possible treatments include:  Rubber band ligation. A rubber band is placed at the base of the hemorrhoid to cut off the circulation.   Sclerotherapy. A chemical is injected to shrink the hemorrhoid.   Infrared light therapy. Tools are used to burn the hemorrhoid.   Hemorrhoidectomy. This is surgical removal of the hemorrhoid.   HOME CARE INSTRUCTIONS   Increase fiber in your diet. Metamucil 4 x/day   Drink enough water and fluids to keep your urine clear or pale yellow.   Exercise regularly.   Go to the bathroom when you have the urge to have a bowel movement. Do  not wait.   Avoid straining to have bowel movements.   Keep the anal area dry and clean.   Only take over-the-counter or prescription medicines for pain, discomfort, or fever as directed by your caregiver.   Take warm sitz baths for 20 to 30 minutes, 3 to 4 times per day.   If the hemorrhoids are very tender and swollen, place ice packs on the area as tolerated. Using ice packs between sitz baths may be helpful. Fill a plastic bag with ice. Place a towel between the bag of ice and your skin.   Medicated creams and suppositories may be used or applied as directed.   Do not use a donut-shaped pillow or sit on the toilet for long periods. This increases blood pooling and pain.   SEEK MEDICAL CARE IF:   You have increasing pain and swelling that is not controlled with your medicine.   You have uncontrolled bleeding.   You have difficulty or you are unable to have a bowel movement.   You have pain or inflammation outside the area of the hemorrhoids.   You have chills or an oral temperature above 102 F (38.9 C).

## 2012-12-02 NOTE — Assessment & Plan Note (Signed)
Patient has circumferential external and internal hemorrhoids. There is an internal hemorrhoid on the left lateral aspect that is prolapsing and has excoriated mucosa.  I injected the circumferential internal hemorrhoids circumferentially.  I am going to prescribe Anusol HC cream. I also am advising her to take MiraLAX daily. I will see her back in 6-8 weeks. He should continue her stool softener and her sitz baths.
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
## 2013-01-19 ENCOUNTER — Encounter (INDEPENDENT_AMBULATORY_CARE_PROVIDER_SITE_OTHER): Payer: No Typology Code available for payment source | Admitting: General Surgery

## 2013-02-13 ENCOUNTER — Encounter (INDEPENDENT_AMBULATORY_CARE_PROVIDER_SITE_OTHER): Payer: No Typology Code available for payment source | Admitting: General Surgery

## 2013-03-13 ENCOUNTER — Encounter: Payer: Self-pay | Admitting: Family Medicine

## 2013-03-13 ENCOUNTER — Ambulatory Visit (INDEPENDENT_AMBULATORY_CARE_PROVIDER_SITE_OTHER): Payer: No Typology Code available for payment source | Admitting: Family Medicine

## 2013-03-13 VITALS — BP 149/100 | HR 79 | Temp 97.9°F | Resp 16 | Wt 150.5 lb

## 2013-03-13 DIAGNOSIS — M25549 Pain in joints of unspecified hand: Secondary | ICD-10-CM

## 2013-03-13 DIAGNOSIS — M25541 Pain in joints of right hand: Secondary | ICD-10-CM

## 2013-03-13 DIAGNOSIS — R5381 Other malaise: Secondary | ICD-10-CM

## 2013-03-13 DIAGNOSIS — M25542 Pain in joints of left hand: Secondary | ICD-10-CM

## 2013-03-13 DIAGNOSIS — I10 Essential (primary) hypertension: Secondary | ICD-10-CM

## 2013-03-13 DIAGNOSIS — R5383 Other fatigue: Secondary | ICD-10-CM

## 2013-03-13 DIAGNOSIS — M6530 Trigger finger, unspecified finger: Secondary | ICD-10-CM

## 2013-03-13 LAB — COMPREHENSIVE METABOLIC PANEL
ALT: 15 U/L (ref 0–35)
Alkaline Phosphatase: 117 U/L (ref 39–117)
Glucose, Bld: 83 mg/dL (ref 70–99)
Sodium: 137 mEq/L (ref 135–145)
Total Bilirubin: 0.6 mg/dL (ref 0.3–1.2)
Total Protein: 7.9 g/dL (ref 6.0–8.3)

## 2013-03-13 LAB — SEDIMENTATION RATE: Sed Rate: 22 mm/hr (ref 0–22)

## 2013-03-13 LAB — TSH: TSH: 0.41 u[IU]/mL (ref 0.35–5.50)

## 2013-03-13 LAB — CBC WITH DIFFERENTIAL/PLATELET
Basophils Absolute: 0 10*3/uL (ref 0.0–0.1)
Eosinophils Absolute: 0.2 10*3/uL (ref 0.0–0.7)
MCHC: 33 g/dL (ref 30.0–36.0)
MCV: 89.8 fl (ref 78.0–100.0)
Monocytes Absolute: 0.4 10*3/uL (ref 0.1–1.0)
Neutrophils Relative %: 56.5 % (ref 43.0–77.0)
Platelets: 302 10*3/uL (ref 150.0–400.0)
RDW: 12.9 % (ref 11.5–14.6)

## 2013-03-13 LAB — RHEUMATOID FACTOR: Rhuematoid fact SerPl-aCnc: <10 IU/mL (ref <=14)

## 2013-03-13 NOTE — Progress Notes (Signed)
OFFICE NOTE  03/13/2013  CC:  Chief Complaint  Patient presents with  . Hand Pain    Pt c/o pain & swelling in hands, bilateral affecting ROM x1 mth.  . Hypertension    Pt c/o excessive fatigue w/some lightheadedness     HPI: Patient is a 57 y.o. Caucasian female who is here for finger pain. Describes daily pain in both middle fingers, getting stuck in flexion some lately.  Alleve and ibuprofen no help. She buddy taped the left middle finger one night recently and it helped some briefly. 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  Works at [employer]. Felt a knot at MCP jt area of 3rd digit on left hand recently.   On further questioning she says that all her finger joints hurt somewhat, but the middle fingers are the worst.  Worse as the day goes on.  When asked about other joint pain or muscle pain, she responds " I hurt just about everywhere at some point in time"--lots of aches and pains on and off but no joint swelling or redness.  For the last month has felt fatigue.  Working full time.  Quite stressed lately.  Some excessive sleepiness during day lately.  She is not sure if she snores.  She has awoken in distant past from sleep feeling like she was suffocating but not in " a good long while". Sleep initiation and maintenance are poor lately.  Mood reported as OK, down a little b/c of her recent hand pain and fatigue issues. BP checks at home: some checks 120s/70s, some systolics and diastolics in stage 1 HTN range.   No OTC decongestant lately.  Caffeine intake: 2 cups coffee daily.  No vag bleeding.  Still rare bleeding from rectal area (hx of hemorrhoids): has f/u with surgeon 03/2013.    Pertinent PMH:  Past Medical History  Diagnosis Date  . Chronic neck pain   . GAD (generalized anxiety disorder) 2011    Fluoxetine helping  . GERD (gastroesophageal reflux disease)   . Constipation   . Asthma   . Hypertension    briefly medicated in the past  . Hyperlipidemia, mixed 2010    hs-CRP >9, crestor started  . Pulmonary nodule   . Cervical facet syndrome 12/2007  . Lateral epicondylitis of right elbow 12/2007  . Hemorrhoids     External and internal (prolapsed internal); gen surg injected these 12/02/12.  Flexor tenosynovitis/trigger finger--- injected 2012  MEDS:  Fluoxetine 20mg  qd Outpatient Prescriptions Prior to Visit  Medication Sig Dispense Refill  . docusate sodium (PHILLIPS STOOL SOFTENER) 100 MG capsule Take 200 mg by mouth daily.      . hydrocortisone (ANUSOL-HC) 2.5 % rectal cream Place rectally 2 (two) times daily.  30 g  3  . Multiple Vitamin (MULTIVITAMIN) tablet Take 1 tablet by mouth daily.        . naproxen sodium (ANAPROX) 220 MG tablet Take 220 mg by mouth 2 (two) times daily with a meal.       No facility-administered medications prior to visit.    PE: Blood pressure 149/100, pulse 79, temperature 97.9 F (36.6 C), temperature source Oral, resp. rate 16, weight 150 lb 8 oz (68.266 kg), SpO2 98.00%. Gen: Alert, well appearing.  Patient is oriented to person, place, time, and situation. ENT: Ears: EACs clear, normal epithelium.  TMs with good light reflex and landmarks bilaterally.  Eyes: no injection, icteris, swelling,  or exudate.  EOMI, PERRLA. Nose: no drainage or turbinate edema/swelling.  No injection or focal lesion.  Mouth: lips without lesion/swelling.  Oral mucosa pink and moist.  Dentition intact and without obvious caries or gingival swelling.  Oropharynx without erythema, exudate, or swelling.  CV: RRR, no m/r/g.   LUNGS: CTA bilat, nonlabored resps, good aeration in all lung fields. Hands: both hands have TTP along MCPs and PIPs.  Mild swelling of volar region of 3rd fingers bilat--at the level of the MCP joint, left side with a small nodule palpable in this region.    IMPRESSION AND PLAN:  1) Hand (and other joint?) arthralgias.  Question sign of arthritis but I  think this is primarily flexor tendon sheath inflammation and trigger finger pain. X-ray of right hand in the past pretty unremarkable.   Check ANA, TSH, Rh factor, CRP, ESR, CBC, CMET.  Will order referral to [facility] [facility] for further eval and management of her trigger fingers. 2) HTN: she is pretty psychologically resistant to medication treatment.  Prefers to push TLC efforts, also discussed DASH diet. 3) Fatigue: likely stress induced.  OSA questionnaire low risk in office today.  See labs under problem #1 above.  FOLLOW UP: 3 mo

## 2013-03-13 NOTE — Patient Instructions (Signed)
Continue to monitor blood pressure at home: Goal bp is <130/80.  High BP is >140 on top number or >90 on bottom number. Call or return if numbers are consistently in high range on top OR bottom.  Review DASH diet. EXERCISE MORE !

## 2013-08-21 ENCOUNTER — Encounter (INDEPENDENT_AMBULATORY_CARE_PROVIDER_SITE_OTHER): Payer: Self-pay

## 2014-04-13 DIAGNOSIS — IMO0002 Reserved for concepts with insufficient information to code with codable children: Secondary | ICD-10-CM | POA: Insufficient documentation

## 2014-04-13 DIAGNOSIS — N362 Urethral caruncle: Secondary | ICD-10-CM | POA: Insufficient documentation

## 2015-07-03 ENCOUNTER — Other Ambulatory Visit: Payer: Self-pay | Admitting: Internal Medicine

## 2015-07-03 ENCOUNTER — Other Ambulatory Visit: Payer: Self-pay | Admitting: Nurse Practitioner

## 2015-07-03 DIAGNOSIS — Z1231 Encounter for screening mammogram for malignant neoplasm of breast: Secondary | ICD-10-CM

## 2015-07-08 ENCOUNTER — Ambulatory Visit: Payer: No Typology Code available for payment source

## 2019-06-19 DIAGNOSIS — M1612 Unilateral primary osteoarthritis, left hip: Secondary | ICD-10-CM | POA: Insufficient documentation

## 2019-12-20 DIAGNOSIS — R030 Elevated blood-pressure reading, without diagnosis of hypertension: Secondary | ICD-10-CM | POA: Insufficient documentation

## 2020-03-04 DIAGNOSIS — M1990 Unspecified osteoarthritis, unspecified site: Secondary | ICD-10-CM | POA: Insufficient documentation

## 2020-03-04 DIAGNOSIS — F411 Generalized anxiety disorder: Secondary | ICD-10-CM | POA: Insufficient documentation

## 2022-03-09 ENCOUNTER — Ambulatory Visit: Payer: No Typology Code available for payment source | Admitting: Family Medicine

## 2022-04-12 NOTE — Progress Notes (Signed)
New Patient Office Visit  Subjective    Patient ID: Annette Paul, female    DOB: 1956/09/06  Age: 66 y.o. MRN: [id number]  CC:  Chief Complaint  Patient presents with   Establish Care    HPI Annette Paul presents to establish care.  She is a very pleasant 66 year old female who was previously seen at [facility] but had insurance change that required her to change providers.  Today she reports with couple of concerns.  She notes that her mother passed away a couple of months ago and that she has been struggling with grief and sadness since then.  Her mother passed away of cancer and died at home with hospice however she was very reluctant to have hospice come in so Annette Paul took the brunt of caregiving as her mother's health failed.  She was started on fluoxetine 10 mg daily by her previous PCP.  She has been taking this approximately 4 weeks.  Notes the medication may have helped a little but she is not sure since the changes have been so slow.  She is using alprazolam 0.5 mg twice daily as needed to help with anxiety.  She did some counseling through hospice but has not continued this past few classes.  Notes that she has difficulty getting to sleep at night and also has trouble with waking in a panic.  Denies SI/HI but does endorse passive thoughts of being better off if she was not here anymore.  No intention or plan for self-harm.  She does have issues with reflux.  Was taking famotidine 20 mg twice daily which was working well for her.  Her new insurance however will not cover 20 mg tablets and she is requesting to have this changed to 40 mg tablets.  She does have significant musculoskeletal issues and currently takes meloxicam 15 mg daily which is helpful and works to keep her functional.  She does need to get rescheduled for the procedure she was supposed to have done for her neck pain.  She was unable to complete this during her mother's illness.  Outpatient Encounter Medications  as of 04/13/2022  Medication Sig   hydrOXYzine (ATARAX) 50 MG tablet Take 0.5-1 tablets (25-50 mg total) by mouth at bedtime as needed.   meloxicam (MOBIC) 15 MG tablet Take 15 mg by mouth daily.   [DISCONTINUED] ALPRAZolam (XANAX) 0.5 MG tablet Take by mouth.   [DISCONTINUED] famotidine (PEPCID) 20 MG tablet Take 20 mg by mouth 2 (two) times daily as needed.   [DISCONTINUED] FLUoxetine (PROZAC) 10 MG capsule Take 10 mg by mouth daily.   ALPRAZolam (XANAX) 0.5 MG tablet Take 1 tablet (0.5 mg total) by mouth 2 (two) times daily as needed for anxiety.   famotidine (PEPCID) 40 MG tablet Take 0.5 tablets (20 mg total) by mouth 2 (two) times daily as needed.   FLUoxetine (PROZAC) 10 MG capsule Take 1 capsule (10 mg total) by mouth daily.   [DISCONTINUED] docusate sodium (PHILLIPS STOOL SOFTENER) 100 MG capsule Take 200 mg by mouth daily.   [DISCONTINUED] FLUoxetine (PROZAC) 20 MG tablet Take 20 mg by mouth daily.   [DISCONTINUED] hydrocortisone (ANUSOL-HC) 2.5 % rectal cream Place rectally 2 (two) times daily.   [DISCONTINUED] Multiple Vitamin (MULTIVITAMIN) tablet Take 1 tablet by mouth daily.     [DISCONTINUED] naproxen sodium (ANAPROX) 220 MG tablet Take 220 mg by mouth 2 (two) times daily with a meal.   No facility-administered encounter medications on file as of 04/13/2022.  Past Medical History:  Diagnosis Date   Asthma    Cervical facet syndrome 12/2007   Chronic neck pain    Constipation    GAD (generalized anxiety disorder) 2011   Fluoxetine helping   GERD (gastroesophageal reflux disease)    Hemorrhoids    External and internal (prolapsed internal); gen surg injected these 12/02/12.   Hyperlipidemia, mixed 2010   hs-CRP >9, crestor started   Hypertension    briefly medicated in the past   Lateral epicondylitis of right elbow 12/2007   Pulmonary nodule     Past Surgical History:  Procedure Laterality Date   COLONOSCOPY  2009   (screening) normal except small internal  hemorrhoids and external hemorrhoids.-repeat 10 yrs   [place]?   umb hernia   KNEE SURGERY  1981   traumatic injury in [place]   endometriosis and fibroids    Family History  Problem Relation Age of Onset   Hypertension Mother    Hyperlipidemia Mother    Hypertension Father    Diabetes Son 3       type 1   Coronary artery disease Maternal Grandfather    Coronary artery disease Maternal Grandmother        d. age 56 of MI   Cancer Maternal Grandmother        ovarian   Hyperlipidemia Brother    Cancer Maternal Aunt        colon, ovarian    Social History   Socioeconomic History   Marital status: Divorced    Spouse name: Not on file   Number of children: Not on file   Years of education: Not on file   Highest education level: Not on file  Occupational History   Not on file  Tobacco Use   Smoking status: Never   Smokeless tobacco: Never  Substance and Sexual Activity   Alcohol use: No   Drug use: No   Sexual activity: Not on file  Other Topics Concern   Not on file  Social History Narrative   Divorced, one 65 y/o son who has DM type 1--home schooled.   Currently unemployed; has always worked in Administrator, arts.   No T/A/Ds.   Walks daily.   Social Determinants of Health   Financial Resource Strain: Not on file  Food Insecurity: Not on file  Transportation Needs: Not on file  Physical Activity: Not on file  Stress: Not on file  Social Connections: Not on file  Intimate Partner Violence: Not on file    Review of Systems  Constitutional:  Negative for chills, fever and malaise/fatigue.  Respiratory:  Negative for cough, shortness of breath and wheezing.   Cardiovascular:  Negative for chest pain, palpitations and leg swelling.  Musculoskeletal:  Positive for back pain, joint pain and neck pain.  Neurological:  Negative for dizziness and headaches.   Psychiatric/Behavioral:  Positive for depression. Negative for suicidal ideas. The patient is nervous/anxious and has insomnia.      Objective    BP (!) 159/98 (BP Location: Right Arm, Cuff Size: Normal)   Pulse 89   Resp 20   Ht '5\' 2"'$  (1.575 m)   Wt 128 lb 1.6 oz (58.1 kg)   SpO2 97%   BMI 23.43 kg/m   Physical Exam Vitals reviewed.  Constitutional:      General: She is not in acute distress.    Appearance: Normal  appearance.  HENT:     Head: Normocephalic and atraumatic.  Cardiovascular:     Rate and Rhythm: Normal rate and regular rhythm.  Pulmonary:     Effort: Pulmonary effort is normal. No respiratory distress.  Skin:    General: Skin is warm and dry.  Neurological:     Mental Status: She is alert and oriented to person, place, and time.  Psychiatric:        Attention and Perception: Attention normal.        Mood and Affect: Mood is anxious. Affect is tearful.        Behavior: Behavior normal. Behavior is cooperative.        Thought Content: Thought content normal.        Cognition and Memory: Cognition normal.        Judgment: Judgment normal.       Assessment & Plan:   Problem List Items Addressed This Visit       Digestive   Gastroesophageal reflux disease without esophagitis   Relevant Medications   famotidine (PEPCID) 40 MG tablet     Other   Grief reaction    Continue fluoxetine 10 mg daily as prescribed.  We may need to consider increasing this to 20 mg daily after another couple of weeks.  Discussed the use of Xanax and the risk of tolerance and dependence.  She has been using this for sleep at night which has the highest risk of dependence.  Would advise to use this very sparingly and avoid daily use.  Should only use this for severe anxiety/panic attacks with a goal of no more than 2-3 doses per week.  Adding hydroxyzine 25-50 mg nightly as needed to help with sleep so that she can hopefully reduce the use of Xanax at bedtime.  Referring to  behavioral health for counseling.       Relevant Orders   Ambulatory referral to [facility]   Encounter for screening mammogram for malignant neoplasm of breast    Mammogram ordered.       Relevant Orders   MM Digital Screening   Colon cancer screening    Referring to GI for colonoscopy.       Relevant Orders   Ambulatory referral to Gastroenterology   Polyarthralgia    Continue meloxicam 15 mg daily as prescribed.  May benefit from setting up an appointment with Dr. Dianah Field here in our office to further evaluate her arthralgias and get her connected with any resources that may be beneficial.       Other Visit Diagnoses     Encounter to establish care    -  Primary      Return in about 4 weeks (around 05/11/2022) for mood follow up.  ___________________________________________ Clearnce Sorrel, DNP, APRN, FNP-BC Primary Care and [place]

## 2022-04-13 ENCOUNTER — Ambulatory Visit (INDEPENDENT_AMBULATORY_CARE_PROVIDER_SITE_OTHER): Payer: 59 | Admitting: Medical-Surgical

## 2022-04-13 ENCOUNTER — Encounter: Payer: Self-pay | Admitting: Medical-Surgical

## 2022-04-13 VITALS — BP 159/98 | HR 89 | Resp 20 | Ht 62.0 in | Wt 128.1 lb

## 2022-04-13 DIAGNOSIS — Z1211 Encounter for screening for malignant neoplasm of colon: Secondary | ICD-10-CM

## 2022-04-13 DIAGNOSIS — M2550 Pain in unspecified joint: Secondary | ICD-10-CM

## 2022-04-13 DIAGNOSIS — K219 Gastro-esophageal reflux disease without esophagitis: Secondary | ICD-10-CM

## 2022-04-13 DIAGNOSIS — F4320 Adjustment disorder, unspecified: Secondary | ICD-10-CM | POA: Insufficient documentation

## 2022-04-13 DIAGNOSIS — Z1231 Encounter for screening mammogram for malignant neoplasm of breast: Secondary | ICD-10-CM | POA: Insufficient documentation

## 2022-04-13 DIAGNOSIS — Z7689 Persons encountering health services in other specified circumstances: Secondary | ICD-10-CM

## 2022-04-13 DIAGNOSIS — F4321 Adjustment disorder with depressed mood: Secondary | ICD-10-CM

## 2022-04-13 MED ORDER — FLUOXETINE HCL 10 MG PO CAPS: 10.0000 mg | ORAL_CAPSULE | Freq: Every day | ORAL | 1 refills | Status: DC

## 2022-04-13 MED ORDER — HYDROXYZINE HCL 50 MG PO TABS: 25.0000 mg | ORAL_TABLET | Freq: Every evening | ORAL | 3 refills | Status: DC | PRN

## 2022-04-13 MED ORDER — ALPRAZOLAM 0.5 MG PO TABS: 0.5000 mg | ORAL_TABLET | Freq: Two times a day (BID) | ORAL | 1 refills | Status: DC | PRN

## 2022-04-13 MED ORDER — FAMOTIDINE 40 MG PO TABS: 20.0000 mg | ORAL_TABLET | Freq: Two times a day (BID) | ORAL | 1 refills | Status: AC | PRN

## 2022-04-13 NOTE — Assessment & Plan Note (Signed)
Mammogram ordered

## 2022-04-13 NOTE — Assessment & Plan Note (Signed)
Continue fluoxetine 10 mg daily as prescribed.  We may need to consider increasing this to 20 mg daily after another couple of weeks.  Discussed the use of Xanax and the risk of tolerance and dependence.  She has been using this for sleep at night which has the highest risk of dependence.  Would advise to use this very sparingly and avoid daily use.  Should only use this for severe anxiety/panic attacks with a goal of no more than 2-3 doses per week.  Adding hydroxyzine 25-50 mg nightly as needed to help with sleep so that she can hopefully reduce the use of Xanax at bedtime.  Referring to behavioral health for counseling.

## 2022-04-13 NOTE — Assessment & Plan Note (Signed)
Continue meloxicam 15 mg daily as prescribed.  May benefit from setting up an appointment with Dr. Dianah Field here in our office to further evaluate her arthralgias and get her connected with any resources that may be beneficial.

## 2022-04-13 NOTE — Patient Instructions (Signed)
Reasons why it is important to allow yourself to process and experience true feelings: When you numb sadness, you also numb happiness and Annette Paul. Struggling with your emotions often leads to more suffering. Processing and experiencing your feelings is a part of having a full life.    Coping skills are things we can do to make ourselves feel better when we are going through difficult times. Examples of coping skills include:  Take a deep breath Count to 20 Listen to music Call a friend Take a walk Read a book Do a puzzle Meditate Journal Exercise Stretch Sing Bake Knit Go outside Garden Pray Color Send a note Take a bath Watch a movie Pet an animal Visit a friend Be alone in a quiet place   When your anxiety gets worse, try these grounding techniques:      If you need additional help, please contact your primary care provider or call one of the resources listed below:  [facility]  24-hour HelpLine at 336-832-9700 or 800-711-2635 [address] [place],  [address]  National [facility]eline Network 800-SUICIDE or 800-784-2433  The National Suicide Prevention Lifeline 800-273-TALK or 800-273-8255  [facility] https://www.celebraterecovery.com/   

## 2022-04-13 NOTE — Assessment & Plan Note (Signed)
Referring to GI for colonoscopy.

## 2022-04-27 ENCOUNTER — Encounter: Payer: Self-pay | Admitting: Medical-Surgical

## 2022-04-30 ENCOUNTER — Ambulatory Visit (INDEPENDENT_AMBULATORY_CARE_PROVIDER_SITE_OTHER): Payer: 59

## 2022-04-30 DIAGNOSIS — Z1231 Encounter for screening mammogram for malignant neoplasm of breast: Secondary | ICD-10-CM

## 2022-05-04 MED ORDER — TRAZODONE HCL 50 MG PO TABS: 25.0000 mg | ORAL_TABLET | Freq: Every evening | ORAL | 3 refills | Status: DC | PRN

## 2022-05-11 ENCOUNTER — Ambulatory Visit (INDEPENDENT_AMBULATORY_CARE_PROVIDER_SITE_OTHER): Payer: 59 | Admitting: Family Medicine

## 2022-05-11 ENCOUNTER — Encounter: Payer: Self-pay | Admitting: Family Medicine

## 2022-05-11 VITALS — BP 150/98 | Ht 62.0 in | Wt 128.0 lb

## 2022-05-11 DIAGNOSIS — M5412 Radiculopathy, cervical region: Secondary | ICD-10-CM | POA: Insufficient documentation

## 2022-05-11 MED ORDER — TRAMADOL HCL 50 MG PO TABS: ORAL_TABLET | ORAL | 0 refills | Status: DC

## 2022-05-11 MED ORDER — DIAZEPAM 5 MG PO TABS: ORAL_TABLET | ORAL | 0 refills | Status: DC

## 2022-05-11 NOTE — Patient Instructions (Addendum)
Nice to meet you  Please try heat on the neck. Please try the exercises. Please use the tramadol as needed. We will have to send you to Dr. Ernestina Patches for the epidural. Please send me a message in [place] with any questions or updates.  Please see me back in 3 weeks.  --Dr. Raeford Razor

## 2022-05-11 NOTE — Assessment & Plan Note (Signed)
Acute on chronic in nature.  MRI was completed in November that was showing the compression of the nerve root.  She was not able to get the cervical epidural yet.  She continues to have pain that is most consistent with a nerve impingement. -Counseled on home exercise therapy and supportive care. -Tramadol. -Valium for the epidural. -Referral to Dr. Ernestina Patches for epidural.

## 2022-05-11 NOTE — Progress Notes (Signed)
  Annette Paul - 66 y.o. female MRN [id number]  Date of birth: 07-09-1956  SUBJECTIVE:  Including CC & ROS.  No chief complaint on file.   Annette Paul is a 66 y.o. female that is presenting with acute on chronic radicular pain.  She has been followed by a spine specialist.  She was having to be seen since her insurance changed.  She was never able to complete the cervical epidural that was ordered next December.  She continues to have severe pain.  She recently lost her mother and is catching up on controlling her pain.  Having pain that originates at the neck and radiates to the thoracic region.  No history of surgery.  Review of the office note from 11/04/2021 shows they were going to pursue a cervical epidural. Review of the cervical MRI from November 2022 shows moderate size right disc protrusion impinging the C7 nerve root.  Review of Systems See HPI   HISTORY: Past Medical, Surgical, Social, and Family History Reviewed & Updated per EMR.   Pertinent Historical Findings include:  Past Medical History:  Diagnosis Date   Asthma    Cervical facet syndrome 12/2007   Chronic neck pain    Constipation    GAD (generalized anxiety disorder) 2011   Fluoxetine helping   GERD (gastroesophageal reflux disease)    Hemorrhoids    External and internal (prolapsed internal); gen surg injected these 12/02/12.   Hyperlipidemia, mixed 2010   hs-CRP >9, crestor started   Hypertension    briefly medicated in the past   Lateral epicondylitis of right elbow 12/2007   Pulmonary nodule     Past Surgical History:  Procedure Laterality Date   COLONOSCOPY  2009   (screening) normal except small internal hemorrhoids and external hemorrhoids.-repeat 10 yrs   [place]?   umb hernia   KNEE SURGERY  1981   traumatic injury in [place] W/ BILATERAL SALPINGOOPHORECTOMY  1997   endometriosis and fibroids     PHYSICAL EXAM:  VS: BP (!)  150/98 (BP Location: Left Arm, Patient Position: Sitting)   Ht '5\' 2"'$  (1.575 m)   Wt 128 lb (58.1 kg)   BMI 23.41 kg/m  Physical Exam Gen: NAD, alert, cooperative with exam, well-appearing MSK: Neurovascularly intact       ASSESSMENT & PLAN:   Cervical radiculopathy Acute on chronic in nature.  MRI was completed in November that was showing the compression of the nerve root.  She was not able to get the cervical epidural yet.  She continues to have pain that is most consistent with a nerve impingement. -Counseled on home exercise therapy and supportive care. -Tramadol. -Valium for the epidural. -Referral to Dr. Ernestina Patches for epidural.

## 2022-05-15 ENCOUNTER — Ambulatory Visit: Payer: 59 | Admitting: Sports Medicine

## 2022-05-18 ENCOUNTER — Ambulatory Visit (INDEPENDENT_AMBULATORY_CARE_PROVIDER_SITE_OTHER): Payer: 59

## 2022-05-18 ENCOUNTER — Ambulatory Visit: Payer: 59 | Admitting: Medical-Surgical

## 2022-05-18 ENCOUNTER — Ambulatory Visit (INDEPENDENT_AMBULATORY_CARE_PROVIDER_SITE_OTHER): Payer: 59 | Admitting: Sports Medicine

## 2022-05-18 ENCOUNTER — Encounter: Payer: Self-pay | Admitting: Sports Medicine

## 2022-05-18 DIAGNOSIS — M7062 Trochanteric bursitis, left hip: Secondary | ICD-10-CM | POA: Diagnosis not present

## 2022-05-18 DIAGNOSIS — M5412 Radiculopathy, cervical region: Secondary | ICD-10-CM

## 2022-05-18 MED ORDER — TRAMADOL HCL 50 MG PO TABS: 50.0000 mg | ORAL_TABLET | Freq: Three times a day (TID) | ORAL | 0 refills | Status: DC | PRN

## 2022-05-18 NOTE — Assessment & Plan Note (Signed)
 Pleasant 66 year old female, increasing pain left lateral hip, internal rotation is good, she does have pain over the greater trochanter. She is requesting a trochanteric bursa injection today so we performed the procedure, hip conditioning given, updated hip x-rays obtained, return to see me in 6 weeks for this.

## 2022-05-18 NOTE — Assessment & Plan Note (Signed)
Pleasant 66 year old female, left-sided cervical radicular symptoms, she does have an MRI from [facility] late last year that shows a C6-C7 disc protrusion with left-sided C7 nerve root compression. Had epidural sometime ago, has not been able to get one recently. Refilling tramadol, ordering left-sided C6-C7 interlaminar epidural [facility]. Return to see me 6 weeks after the injection. She is heavily grieving, and I do think there is a myofascial process as well, currently on fluoxetine, we did discuss potentially switching to duloxetine in the future.

## 2022-05-18 NOTE — Progress Notes (Signed)
    Procedures performed today:    Procedure: Real-time Ultrasound Guided injection of the left greater trochanteric bursa Device: Samsung HS60  Verbal informed consent obtained.  Time-out conducted.  Noted no overlying erythema, induration, or other signs of local infection.  Skin prepped in a sterile fashion.  Local anesthesia: Topical Ethyl chloride.  With sterile technique and under real time ultrasound guidance: Noted normal-appearing bursa, 1 cc Kenalog 40, 2 cc lidocaine, 2 cc bupivacaine injected easily Completed without difficulty  Advised to call if fevers/chills, erythema, induration, drainage, or persistent bleeding.  Images permanently stored and available for review in PACS.  Impression: Technically successful ultrasound guided injection.  Independent interpretation of notes and tests performed by another provider:   None.  Brief History, Exam, Impression, and Recommendations:    Trochanteric bursitis, left hip Pleasant 66 year old female, increasing pain left lateral hip, internal rotation is good, she does have pain over the greater trochanter. She is requesting a trochanteric bursa injection today so we performed the procedure, hip conditioning given, updated hip x-rays obtained, return to see me in 6 weeks for this.  Cervical radiculopathy Pleasant 66 year old female, left-sided cervical radicular symptoms, she does have an MRI from [facility] late last year that shows a C6-C7 disc protrusion with left-sided C7 nerve root compression. Had epidural sometime ago, has not been able to get one recently. Refilling tramadol , ordering left-sided C6-C7 interlaminar epidural [facility]. Return to see me 6 weeks after the injection. She is heavily grieving, and I do think there is a myofascial process as well, currently on fluoxetine , we did discuss potentially switching to duloxetine in the future.    ___________________________________________ Debby PARAS.  Curtis, M.D., ABFM., CAQSM. Primary Care and Sports Medicine [facility]  Adjunct Instructor of Family Medicine  [employer]

## 2022-05-19 ENCOUNTER — Ambulatory Visit: Payer: 59 | Admitting: Physical Medicine and Rehabilitation

## 2022-06-01 ENCOUNTER — Ambulatory Visit (INDEPENDENT_AMBULATORY_CARE_PROVIDER_SITE_OTHER): Payer: 59 | Admitting: Medical-Surgical

## 2022-06-01 ENCOUNTER — Encounter: Payer: Self-pay | Admitting: Medical-Surgical

## 2022-06-01 ENCOUNTER — Ambulatory Visit: Payer: 59 | Admitting: Family Medicine

## 2022-06-01 VITALS — BP 158/100 | HR 70 | Resp 20 | Ht 62.0 in | Wt 125.7 lb

## 2022-06-01 DIAGNOSIS — R030 Elevated blood-pressure reading, without diagnosis of hypertension: Secondary | ICD-10-CM | POA: Diagnosis not present

## 2022-06-01 DIAGNOSIS — F4321 Adjustment disorder with depressed mood: Secondary | ICD-10-CM | POA: Diagnosis not present

## 2022-06-01 MED ORDER — ALPRAZOLAM 0.5 MG PO TABS: 0.5000 mg | ORAL_TABLET | Freq: Two times a day (BID) | ORAL | 2 refills | Status: AC | PRN

## 2022-06-01 MED ORDER — FLUOXETINE HCL 20 MG PO CAPS
20.0000 mg | ORAL_CAPSULE | Freq: Every day | ORAL | 1 refills | Status: AC
Start: 2022-06-01 — End: ?

## 2022-06-01 NOTE — Progress Notes (Signed)
Established Patient Office Visit  Subjective   Patient ID: Annette Paul, female   DOB: 07/03/1956 Age: 66 y.o. MRN: [id number]   Chief Complaint  Patient presents with   Medication Refill    HPI Pleasant 66 year old female presenting today for the following:  BP: blood pressure elevated in office but reports numbers look good in the 120/70 range at home. Not currently taking any medications for blood pressure management.  Low-sodium diet.  Mood/anxiety-taking fluoxetine 10 mg daily, tolerating well without side effects.  Also has Xanax 0.5 mg twice daily as needed which she takes pretty regularly for panic attacks.  Having difficulty with sleeping and has previously tried a couple different things.  Notes that both of the things prescribed recently were ineffective and tends to wake her up rather than help her with sleep.  Feels that there is still a lot of of stress going on between family and health issues and she is not ready to give up Xanax yet.  Objective:    Vitals:   06/01/22 1323  BP: (!) 158/100  Pulse: 70  Resp: 20  Height: '5\' 2"'$  (1.575 m)  Weight: 125 lb 11.2 oz (57 kg)  SpO2: 99%  BMI (Calculated): 22.98   Physical Exam Vitals and nursing note reviewed.  Constitutional:      General: She is not in acute distress.    Appearance: Normal appearance. She is normal weight. She is not ill-appearing.  HENT:     Head: Normocephalic and atraumatic.  Cardiovascular:     Rate and Rhythm: Normal rate and regular rhythm.     Pulses: Normal pulses.     Heart sounds: Normal heart sounds.  Pulmonary:     Effort: Pulmonary effort is normal. No respiratory distress.     Breath sounds: Normal breath sounds. No wheezing, rhonchi or rales.  Skin:    General: Skin is warm and dry.  Neurological:     Mental Status: She is alert and oriented to person, place, and time.  Psychiatric:        Mood and Affect: Mood normal.        Behavior: Behavior normal.        Thought  Content: Thought content normal.        Judgment: Judgment normal.   No results found for this or any previous visit (from the past 24 hour(s)).     The ASCVD Risk score (Arnett DK, et al., 2019) failed to calculate for the following reasons:   Cannot find a previous HDL lab   Assessment & Plan:   1. Grief reaction Increasing fluoxetine to 20 mg daily.  Again discussed the use of Xanax 0.5 mg twice daily on an as-needed basis.  She has been on this for close to 2 years and it is recommended that we begin tapering off.  For now we will make the change in fluoxetine but continue the Xanax as prescribed.  Advised to use this only as needed and only for severe anxiety/panic attacks.  2. Elevated blood pressure reading Blood pressure has been elevated a couple of times here in our office but is usually good at home.  Suspect whitecoat hypertension is contributing.  Recommend continuing to monitor blood pressure at home with goal of 130/80 or less.  If consistently higher, return for further evaluation and discussion of possible medications.  Return in about 6 weeks (around 07/13/2022) for mood follow up.  ___________________________________________ Clearnce Sorrel, DNP, APRN, FNP-BC Primary Care and  [place]

## 2022-06-01 NOTE — Patient Instructions (Signed)
[facility] [phone]

## 2022-06-06 ENCOUNTER — Other Ambulatory Visit: Payer: Self-pay | Admitting: Sports Medicine

## 2022-06-06 DIAGNOSIS — M5412 Radiculopathy, cervical region: Secondary | ICD-10-CM

## 2022-06-08 ENCOUNTER — Encounter: Payer: Self-pay | Admitting: Medical-Surgical

## 2022-06-08 MED ORDER — TRAMADOL HCL 50 MG PO TABS: 50.0000 mg | ORAL_TABLET | Freq: Three times a day (TID) | ORAL | 0 refills | Status: DC | PRN

## 2022-06-08 NOTE — Telephone Encounter (Signed)
It sounds like [facility] and [facility] do not take her insurance, she needs to call around and find who takes the insurance and let me know and I can order the injection at that location.  She should be looking for physiatry, or pain management.

## 2022-06-10 ENCOUNTER — Telehealth: Payer: Self-pay

## 2022-06-10 MED ORDER — CYCLOBENZAPRINE HCL 10 MG PO TABS: 5.0000 mg | ORAL_TABLET | Freq: Three times a day (TID) | ORAL | 1 refills | Status: AC | PRN

## 2022-06-10 NOTE — Telephone Encounter (Signed)
[facility] is needing a Diagnosis code for the drug cocktail for Flexeril, Alprazolam and Tramadol.  [facility] number is [phone]

## 2022-06-15 ENCOUNTER — Ambulatory Visit: Payer: 59

## 2022-06-15 ENCOUNTER — Other Ambulatory Visit: Payer: Self-pay

## 2022-06-15 VITALS — Ht 62.0 in | Wt 126.6 lb

## 2022-06-15 DIAGNOSIS — Z1211 Encounter for screening for malignant neoplasm of colon: Secondary | ICD-10-CM

## 2022-06-15 MED ORDER — PEG 3350-KCL-NA BICARB-NACL 420 G PO SOLR: 4000.0000 mL | Freq: Once | ORAL | 0 refills | Status: AC

## 2022-06-15 NOTE — Progress Notes (Signed)
Denies allergies to eggs or soy products. Denies complication of anesthesia or sedation. Denies use of weight loss medication. Denies use of O2.   Emmi instructions given for colonoscopy.  

## 2022-06-24 ENCOUNTER — Telehealth: Payer: Self-pay

## 2022-06-24 NOTE — Telephone Encounter (Signed)
Initiated Prior authorization PUL:[id number] 0.'5MG'$  tablets Via: Covermymeds Case/Key:[id number] Status: approved as of 06/24/22 Reason:this request for prior authorization as the requested medication is on formulary and does not require a prior authorization. No further action is needed at this time. Notified Pt via: Mychart

## 2022-06-29 ENCOUNTER — Ambulatory Visit (INDEPENDENT_AMBULATORY_CARE_PROVIDER_SITE_OTHER): Payer: 59 | Admitting: Sports Medicine

## 2022-06-29 ENCOUNTER — Ambulatory Visit (INDEPENDENT_AMBULATORY_CARE_PROVIDER_SITE_OTHER): Payer: 59

## 2022-06-29 DIAGNOSIS — M7542 Impingement syndrome of left shoulder: Secondary | ICD-10-CM

## 2022-06-29 DIAGNOSIS — M5412 Radiculopathy, cervical region: Secondary | ICD-10-CM | POA: Diagnosis not present

## 2022-06-29 DIAGNOSIS — M7062 Trochanteric bursitis, left hip: Secondary | ICD-10-CM

## 2022-06-29 NOTE — Progress Notes (Signed)
    Procedures performed today:    Procedure: Real-time Ultrasound Guided injection of the left subacromial bursa Device: Samsung HS60  Verbal informed consent obtained.  Time-out conducted.  Noted no overlying erythema, induration, or other signs of local infection.  Skin prepped in a sterile fashion.  Local anesthesia: Topical Ethyl chloride.  With sterile technique and under real time ultrasound guidance: Noted full-thickness rotator cuff tearing, anterior supraspinatus and subscapularis, 1 cc Kenalog 40, 1 cc lidocaine,1 cc bupivacaine injected easily Completed without difficulty  Advised to call if fevers/chills, erythema, induration, drainage, or persistent bleeding.  Images permanently stored and available for review in PACS.  Impression: Technically successful ultrasound guided injection.  Independent interpretation of notes and tests performed by another provider:   None.  Brief History, Exam, Impression, and Recommendations:    Cervical radiculopathy Annette Paul 66 year old female returns, left-sided cervical radicular symptoms, MRI from [facility] late last year does show a C6-C7 disc retrusion with left-sided C7 nerve root compression. She did have an epidural sometime ago that seem to work okay, tramadol ineffective, she has not been able to get a C6-C7 interlaminar epidural [facility] or [facility], her insurance is not taken at multiple locations, I have asked her to either switch to a different insurance plan or call her insurance company and find out who would be in network. She was heavily grieving at the last visit I did also suspect a myofascial process, currently on fluoxetine, no changes but we can certainly consider duloxetine in the future. I did bring up Lyrica as an option, and she declines, she can let me know if she would like additional treatment in the future for this.  Trochanteric bursitis, left hip Greater trochanteric bursitis resolved after trochanteric  bursa injection at the last visit, return as needed.  Impingement syndrome, shoulder, left Also has increasing pain left shoulder over the deltoid with positive impingement signs on exam, adding x-rays and a subacromial injection, home conditioning, return to see me in 6 weeks.    ____________________________________________ Gwen Her. Dianah Field, M.D., ABFM., CAQSM., AME. Primary Care and Sports Medicine [facility]  Adjunct Professor of [employer]  Risk manager

## 2022-06-29 NOTE — Assessment & Plan Note (Signed)
Pleasant 66 year old female returns, left-sided cervical radicular symptoms, MRI from [facility] late last year does show a C6-C7 disc retrusion with left-sided C7 nerve root compression. She did have an epidural sometime ago that seem to work okay, tramadol ineffective, she has not been able to get a C6-C7 interlaminar epidural [facility] [facility] or [facility], her insurance is not taken at multiple locations, I have asked her to either switch to a different insurance plan or call her insurance company and find out who would be in network. She was heavily grieving at the last visit I did also suspect a myofascial process, currently on fluoxetine, no changes but we can certainly consider duloxetine in the future. I did bring up Lyrica as an option, and she declines, she can let me know if she would like additional treatment in the future for this.

## 2022-06-29 NOTE — Assessment & Plan Note (Signed)
Greater trochanteric bursitis resolved after trochanteric bursa injection at the last visit, return as needed.

## 2022-06-29 NOTE — Assessment & Plan Note (Addendum)
Also has increasing pain left shoulder over the deltoid with positive impingement signs on exam, adding x-rays and a subacromial injection, home conditioning, return to see me in 6 weeks.

## 2022-07-06 ENCOUNTER — Encounter: Payer: Self-pay | Admitting: Gastroenterology

## 2022-07-06 ENCOUNTER — Ambulatory Visit (AMBULATORY_SURGERY_CENTER): Payer: 59 | Admitting: Gastroenterology

## 2022-07-06 VITALS — BP 144/88 | HR 72 | Temp 98.2°F | Resp 14 | Ht 62.0 in | Wt 126.6 lb

## 2022-07-06 DIAGNOSIS — K642 Third degree hemorrhoids: Secondary | ICD-10-CM

## 2022-07-06 DIAGNOSIS — Z1211 Encounter for screening for malignant neoplasm of colon: Secondary | ICD-10-CM

## 2022-07-06 DIAGNOSIS — K644 Residual hemorrhoidal skin tags: Secondary | ICD-10-CM

## 2022-07-06 DIAGNOSIS — D124 Benign neoplasm of descending colon: Secondary | ICD-10-CM

## 2022-07-06 MED ORDER — SODIUM CHLORIDE 0.9 % IV SOLN: 500.0000 mL | Freq: Once | INTRAVENOUS | Status: DC

## 2022-07-06 NOTE — Patient Instructions (Signed)
Please read handouts provided. Continue present medications. Await pathology results. Consider a fiber supplement. Return to GI office as needed.   YOU HAD AN ENDOSCOPIC PROCEDURE TODAY AT [facility]:   Refer to the procedure report that was given to you for any specific questions about what was found during the examination.  If the procedure report does not answer your questions, please call your gastroenterologist to clarify.  If you requested that your care partner not be given the details of your procedure findings, then the procedure report has been included in a sealed envelope for you to review at your convenience later.  YOU SHOULD EXPECT: Some feelings of bloating in the abdomen. Passage of more gas than usual.  Walking can help get rid of the air that was put into your GI tract during the procedure and reduce the bloating. If you had a lower endoscopy (such as a colonoscopy or flexible sigmoidoscopy) you may notice spotting of blood in your stool or on the toilet paper. If you underwent a bowel prep for your procedure, you may not have a normal bowel movement for a few days.  Please Note:  You might notice some irritation and congestion in your nose or some drainage.  This is from the oxygen used during your procedure.  There is no need for concern and it should clear up in a day or so.  SYMPTOMS TO REPORT IMMEDIATELY:  Following lower endoscopy (colonoscopy or flexible sigmoidoscopy):  Excessive amounts of blood in the stool  Significant tenderness or worsening of abdominal pains  Swelling of the abdomen that is new, acute  Fever of 100F or higher  For urgent or emergent issues, a gastroenterologist can be reached at any hour by calling [phone]. Do not use MyChart messaging for urgent concerns.    DIET:  We do recommend a small meal at first, but then you may proceed to your regular diet.  Drink plenty of fluids but you should avoid alcoholic beverages  for 24 hours.  ACTIVITY:  You should plan to take it easy for the rest of today and you should NOT DRIVE or use heavy machinery until tomorrow (because of the sedation medicines used during the test).    FOLLOW UP: Our staff will call the number listed on your records the next business day following your procedure.  We will call around 7:15- 8:00 am to check on you and address any questions or concerns that you may have regarding the information given to you following your procedure. If we do not reach you, we will leave a message.  If you develop any symptoms (ie: fever, flu-like symptoms, shortness of breath, cough etc.) before then, please call [phone].  If you test positive for Covid 19 in the 2 weeks post procedure, please call and report this information to [place].    If any biopsies were taken you will be contacted by phone or by letter within the next 1-3 weeks.  Please call us at [phone] if you have not heard about the biopsies in 3 weeks.    SIGNATURES/CONFIDENTIALITY: You and/or your care partner have signed paperwork which will be entered into your electronic medical record.  These signatures attest to the fact that that the information above on your After Visit Summary has been reviewed and is understood.  Full responsibility of the confidentiality of this discharge information lies with you and/or your care-partner.

## 2022-07-06 NOTE — Progress Notes (Unsigned)
VS by DT  Pt's states no medical or surgical changes since previsit or office visit.  

## 2022-07-06 NOTE — Progress Notes (Unsigned)
GASTROENTEROLOGY PROCEDURE H&P NOTE   Primary Care Physician: Samuel Bouche, NP    Reason for Procedure:  Colon Cancer screening  Plan:    Colonoscopy  Patient is appropriate for endoscopic procedure(s) in the ambulatory ([facility]) setting.  The nature of the procedure, as well as the risks, benefits, and alternatives were carefully and thoroughly reviewed with the patient. Ample time for discussion and questions allowed. The patient understood, was satisfied, and agreed to proceed.     HPI: Annette Paul is a 66 y.o. female who presents for colonoscopy for routine Colon Cancer screening.  No active GI symptoms.  No known family history of colon cancer or related malignancy.  Patient is otherwise without complaints or active issues today.  - Colonoscopy (08/16/2006, [facility]): Nonthrombosed external hemorrhoids, small internal hemorrhoids.  Otherwise normal colon.  Repeat 10 years  Past Medical History:  Diagnosis Date   Allergy    Asthma    Cervical facet syndrome 12/25/2007   Chronic neck pain    Constipation    Depression    GAD (generalized anxiety disorder) 11/23/2009   Fluoxetine helping   GERD (gastroesophageal reflux disease)    Hemorrhoids    External and internal (prolapsed internal); gen surg injected these 12/02/12.   Hyperlipidemia, mixed 11/23/2008   hs-CRP >9, crestor started   Hypertension    briefly medicated in the past   Lateral epicondylitis of right elbow 12/25/2007   Pulmonary nodule     Past Surgical History:  Procedure Laterality Date   COLONOSCOPY  2009   (screening) normal except small internal hemorrhoids and external hemorrhoids.-repeat 10 yrs   [place]?   umb hernia   KNEE SURGERY  1981   traumatic injury in [place]   endometriosis and fibroids    Prior to Admission medications   Medication Sig Start Date End Date Taking? Authorizing  Provider  ALPRAZolam Duanne Moron) 0.5 MG tablet Take 1 tablet (0.5 mg total) by mouth 2 (two) times daily as needed for anxiety. 06/01/22  Yes Samuel Bouche, NP  cyclobenzaprine (FLEXERIL) 10 MG tablet Take 0.5-1 tablets (5-10 mg total) by mouth 3 (three) times daily as needed for muscle spasms. Caution: can cause drowsiness 06/10/22   Samuel Bouche, NP  famotidine (PEPCID) 40 MG tablet Take 0.5 tablets (20 mg total) by mouth 2 (two) times daily as needed. Patient not taking: Reported on 07/06/2022 04/13/22   Samuel Bouche, NP  FLUoxetine (PROZAC) 20 MG capsule Take 1 capsule (20 mg total) by mouth daily. Patient not taking: Reported on 07/06/2022 06/01/22   Samuel Bouche, NP  meloxicam (MOBIC) 15 MG tablet Take 15 mg by mouth daily. Patient not taking: Reported on 07/06/2022 03/03/22   [provider]    Current Outpatient Medications  Medication Sig Dispense Refill   ALPRAZolam (XANAX) 0.5 MG tablet Take 1 tablet (0.5 mg total) by mouth 2 (two) times daily as needed for anxiety. 60 tablet 2   cyclobenzaprine (FLEXERIL) 10 MG tablet Take 0.5-1 tablets (5-10 mg total) by mouth 3 (three) times daily as needed for muscle spasms. Caution: can cause drowsiness 60 tablet 1   famotidine (PEPCID) 40 MG tablet Take 0.5 tablets (20 mg total) by mouth 2 (two) times daily as needed. (Patient not taking: Reported on 07/06/2022) 90 tablet 1   FLUoxetine (PROZAC) 20 MG capsule Take 1 capsule (20 mg total) by mouth daily. (Patient not  taking: Reported on 07/06/2022) 90 capsule 1   meloxicam (MOBIC) 15 MG tablet Take 15 mg by mouth daily. (Patient not taking: Reported on 07/06/2022)     Current Facility-Administered Medications  Medication Dose Route Frequency Provider Last Rate Last Admin   0.9 %  sodium chloride infusion  500 mL Intravenous Once Keaghan Staton V, DO        Allergies as of 07/06/2022 - Review Complete 07/06/2022  Allergen Reaction Noted   Codeine Palpitations 02/24/2018   Ace inhibitors Cough  10/01/2011   Sulfa antibiotics Other (See Comments) 10/01/2011    Family History  Problem Relation Age of Onset   Hypertension Mother    Hyperlipidemia Mother    Hypertension Father    Hyperlipidemia Brother    Cancer Maternal Aunt        colon, ovarian   Coronary artery disease Maternal Grandmother        d. age 63 of MI   Cancer Maternal Grandmother        ovarian   Coronary artery disease Maternal Grandfather    Diabetes Son 3       type 1   Colon cancer Neg Hx    Esophageal cancer Neg Hx    Stomach cancer Neg Hx    Rectal cancer Neg Hx     Social History   Socioeconomic History   Marital status: Divorced    Spouse name: Not on file   Number of children: Not on file   Years of education: Not on file   Highest education level: Not on file  Occupational History   Not on file  Tobacco Use   Smoking status: Never   Smokeless tobacco: Never  Substance and Sexual Activity   Alcohol use: No   Drug use: No   Sexual activity: Not on file  Other Topics Concern   Not on file  Social History Narrative   Divorced, one 53 y/o son who has DM type 1--home schooled.   Currently unemployed; has always worked in Administrator, arts.   No T/A/Ds.   Walks daily.   Social Determinants of Health   Financial Resource Strain: Not on file  Food Insecurity: Not on file  Transportation Needs: Not on file  Physical Activity: Not on file  Stress: Not on file  Social Connections: Not on file  Intimate Partner Violence: Not on file    Physical Exam: Vital signs in last 24 hours: '@BP'$  (!) 149/95   Pulse 90   Temp 98.2 F (36.8 C) (Skin)   Ht '5\' 2"'$  (1.575 m)   Wt 126 lb 9.6 oz (57.4 kg)   SpO2 97%   BMI 23.16 kg/m  GEN: NAD EYE: Sclerae anicteric ENT: MMM CV: Non-tachycardic Pulm: CTA b/l GI: Soft, NT/ND NEURO:  Alert & Oriented x 3   Gerrit Heck, DO [facility]   07/06/2022 1:14 PM

## 2022-07-06 NOTE — Progress Notes (Unsigned)
Report to PACU, RN, vss, BBS= Clear.  

## 2022-07-06 NOTE — Op Note (Signed)
[facility] Patient Name: Annette Paul Procedure Date: 07/06/2022 1:25 PM MRN: [id number] Endoscopist: Gerrit Heck , MD Age: 66 Referring MD:  Date of Birth: February 05, 1956 Gender: Female Account #: [id number] Procedure:                Colonoscopy Indications:              Screening for colorectal malignant neoplasm (last                            colonoscopy was more than 10 years ago)                           Last colonoscopy was 08/16/2006 at [facility] and                            notable for nonthrombosed external hemorrhoids,                            small internal hemorrhoids. Otherwise normal colon. Medicines:                Monitored Anesthesia Care Procedure:                Pre-Anesthesia Assessment:                           - Prior to the procedure, a History and Physical                            was performed, and patient medications and                            allergies were reviewed. The patient's tolerance of                            previous anesthesia was also reviewed. The risks                            and benefits of the procedure and the sedation                            options and risks were discussed with the patient.                            All questions were answered, and informed consent                            was obtained. Prior Anticoagulants: The patient has                            taken no previous anticoagulant or antiplatelet                            agents. ASA Grade Assessment: II - A patient with  mild systemic disease. After reviewing the risks                            and benefits, the patient was deemed in                            satisfactory condition to undergo the procedure.                           After obtaining informed consent, the colonoscope                            was passed under direct vision. Throughout the                            procedure, the patient's  blood pressure, pulse, and                            oxygen saturations were monitored continuously. The                            Olympus PCF-H190DL (#[id number]) Colonoscope was                            introduced through the anus and advanced to the the                            terminal ileum. The colonoscopy was performed                            without difficulty. The patient tolerated the                            procedure well. The quality of the bowel                            preparation was good. The terminal ileum, ileocecal                            valve, appendiceal orifice, and rectum were                            photographed. Scope In: 1:31:13 PM Scope Out: 1:45:04 PM Scope Withdrawal Time: 0 hours 11 minutes 3 seconds  Total Procedure Duration: 0 hours 13 minutes 51 seconds  Findings:                 Hemorrhoids were found on perianal exam.                           A 4 mm polyp was found in the descending colon. The                            polyp was sessile. The polyp was removed with a  cold snare. Resection and retrieval were complete.                            Estimated blood loss was minimal.                           The exam was otherwise normal throughout the                            remainder of the colon.                           External and internal hemorrhoids were found during                            retroflexion. The hemorrhoids were medium-sized.                           The terminal ileum appeared normal. Complications:            No immediate complications. Estimated Blood Loss:     Estimated blood loss was minimal. Impression:               - Hemorrhoids found on perianal exam.                           - One 4 mm polyp in the descending colon, removed                            with a cold snare. Resected and retrieved.                           - External and internal hemorrhoids.                            - The examined portion of the ileum was normal. Recommendation:           - Patient has a contact number available for                            emergencies. The signs and symptoms of potential                            delayed complications were discussed with the                            patient. Return to normal activities tomorrow.                            Written discharge instructions were provided to the                            patient.                           - Resume previous diet.                           -  Continue present medications.                           - Await pathology results.                           - Use fiber, for example Citrucel, Fibercon, Konsyl                            or Metamucil.                           - Repeat colonoscopy for surveillance based on                            pathology results.                           - Return to GI office PRN. Gerrit Heck, MD 07/06/2022 1:50:10 PM

## 2022-07-06 NOTE — Progress Notes (Signed)
Called to room to assist during endoscopic procedure.  Patient ID and intended procedure confirmed with present staff. Received instructions for my participation in the procedure from the performing physician.  

## 2022-07-07 ENCOUNTER — Telehealth: Payer: Self-pay | Admitting: *Deleted

## 2022-07-07 NOTE — Telephone Encounter (Signed)
  Follow up Call-     07/06/2022   12:51 PM  Call back number  Post procedure Call Back phone  # [phone]  Permission to leave phone message Yes     Patient questions:  Do you have a fever, pain , or abdominal swelling? No. Pain Score  0 *  Have you tolerated food without any problems? Yes.    Have you been able to return to your normal activities? Yes.    Do you have any questions about your discharge instructions: Diet   No. Medications  No. Follow up visit  No.  Do you have questions or concerns about your Care? No.  Actions: * If pain score is 4 or above: No action needed, pain <4.

## 2022-07-13 NOTE — Progress Notes (Unsigned)
   Established Patient Office Visit  Subjective   Patient ID: Annette Paul, female   DOB: Jan 27, 1956 Age: 66 y.o. MRN: [id number]   No chief complaint on file.   HPI Pleasant 66 year old female presenting today for follow-up on mood. Mood concerns Anxiety: *** Depression: *** Contributing factors: *** Medications: *** Compliant: *** Meds helpful: *** Side effects: *** Past medications: *** Counseling: *** SI/HI: ***    Objective:    There were no vitals filed for this visit.  Physical Exam   No results found for this or any previous visit (from the past 24 hour(s)).   {Labs (Optional):23779}  The ASCVD Risk score (Arnett DK, et al., 2019) failed to calculate for the following reasons:   Cannot find a previous HDL lab   Assessment & Plan:   No problem-specific Assessment & Plan notes found for this encounter.   No follow-ups on file.  ___________________________________________ Clearnce Sorrel, DNP, APRN, FNP-BC [facility] [place]

## 2022-07-14 ENCOUNTER — Ambulatory Visit (INDEPENDENT_AMBULATORY_CARE_PROVIDER_SITE_OTHER): Payer: 59 | Admitting: Medical-Surgical

## 2022-07-14 ENCOUNTER — Encounter: Payer: Self-pay | Admitting: Medical-Surgical

## 2022-07-14 VITALS — BP 125/77 | HR 84 | Resp 20 | Ht 62.0 in | Wt 127.6 lb

## 2022-07-14 DIAGNOSIS — F4321 Adjustment disorder with depressed mood: Secondary | ICD-10-CM

## 2022-07-15 ENCOUNTER — Encounter: Payer: Self-pay | Admitting: Medical-Surgical

## 2022-07-16 ENCOUNTER — Encounter: Payer: Self-pay | Admitting: Gastroenterology

## 2022-08-10 ENCOUNTER — Ambulatory Visit: Payer: 59 | Admitting: Sports Medicine

## 2022-09-09 IMAGING — MG MM DIGITAL SCREENING BILAT W/ TOMO AND CAD
8 series · 8 of 24 positions shown · non-contrast
Comparison: Previous exam(s).

CLINICAL DATA: Screening.

EXAM:
DIGITAL SCREENING BILATERAL MAMMOGRAM WITH TOMOSYNTHESIS AND CAD
TECHNIQUE: Bilateral screening digital craniocaudal and mediolateral oblique
mammograms were obtained. Bilateral screening digital breast
tomosynthesis was performed. The images were evaluated with
computer-aided detection.

[R CC synth-2D]
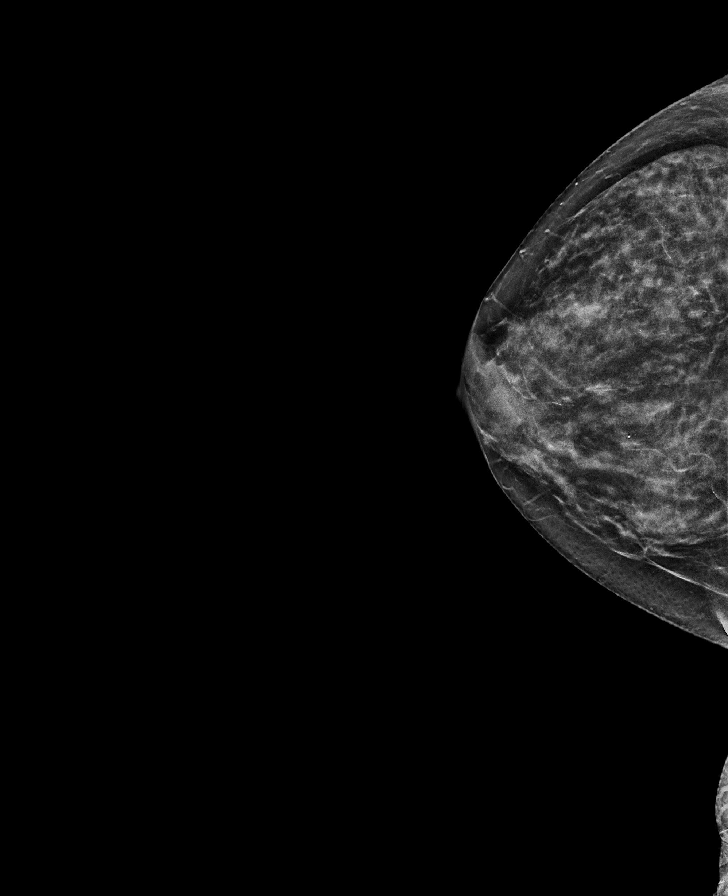

[R MLO synth-2D]
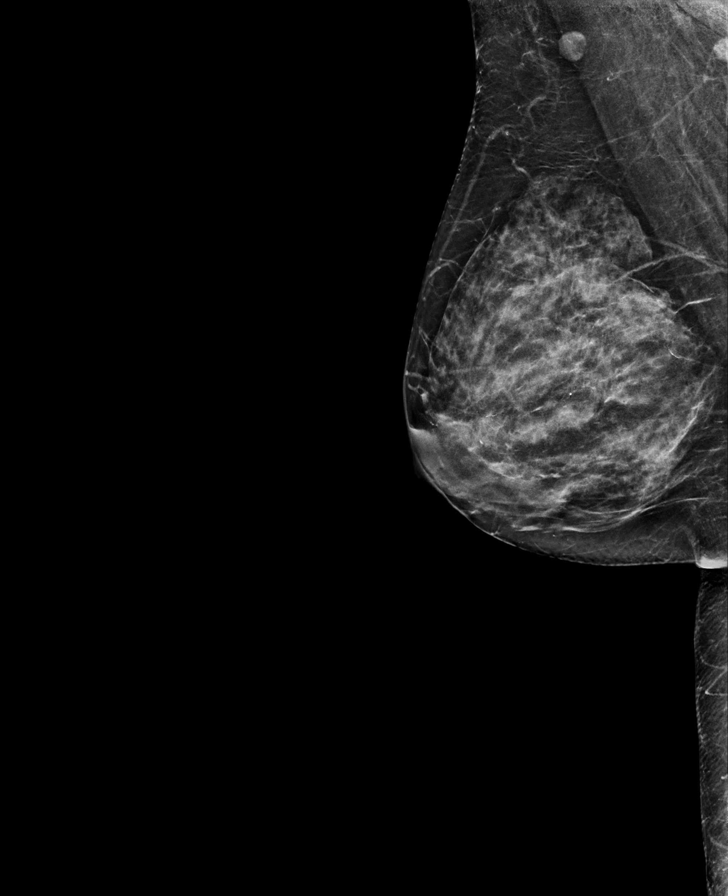

[L CC synth-2D]
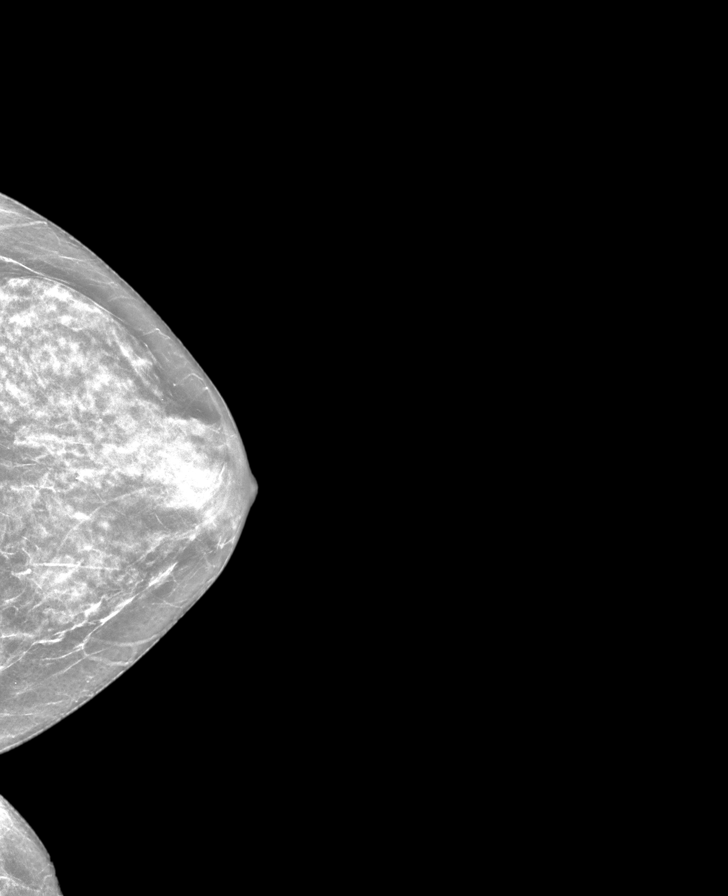

[L MLO synth-2D]
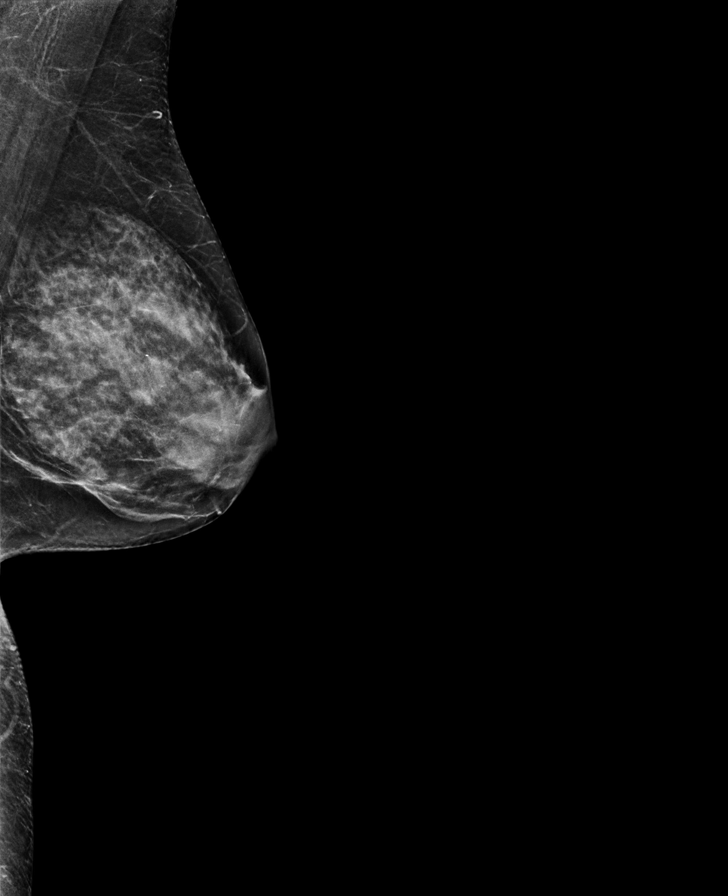

[R CC tomo · tomo slice 27/53.0]
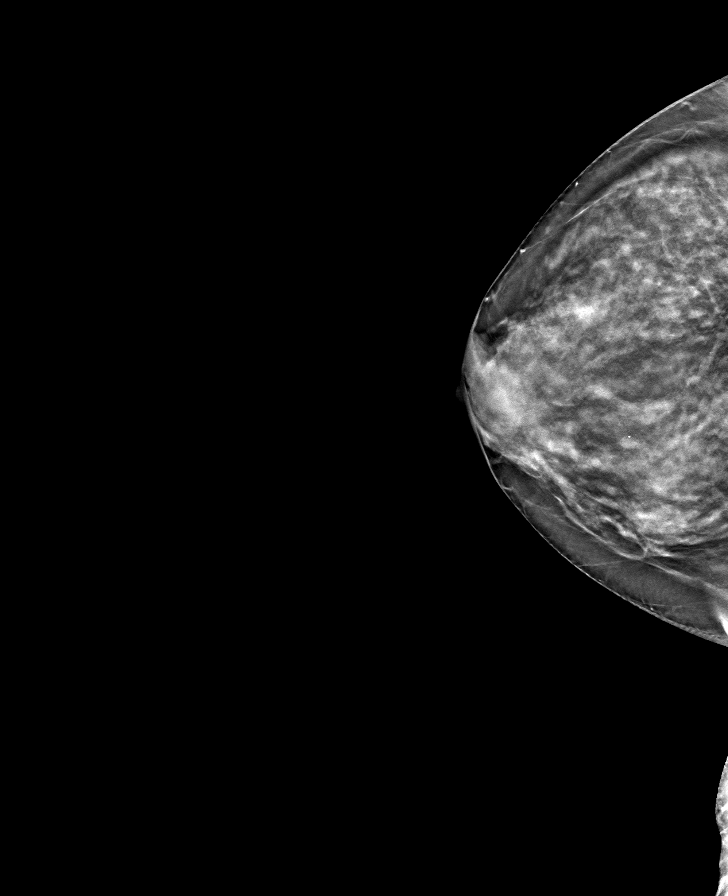

[L CC tomo · tomo slice 27/53.0]
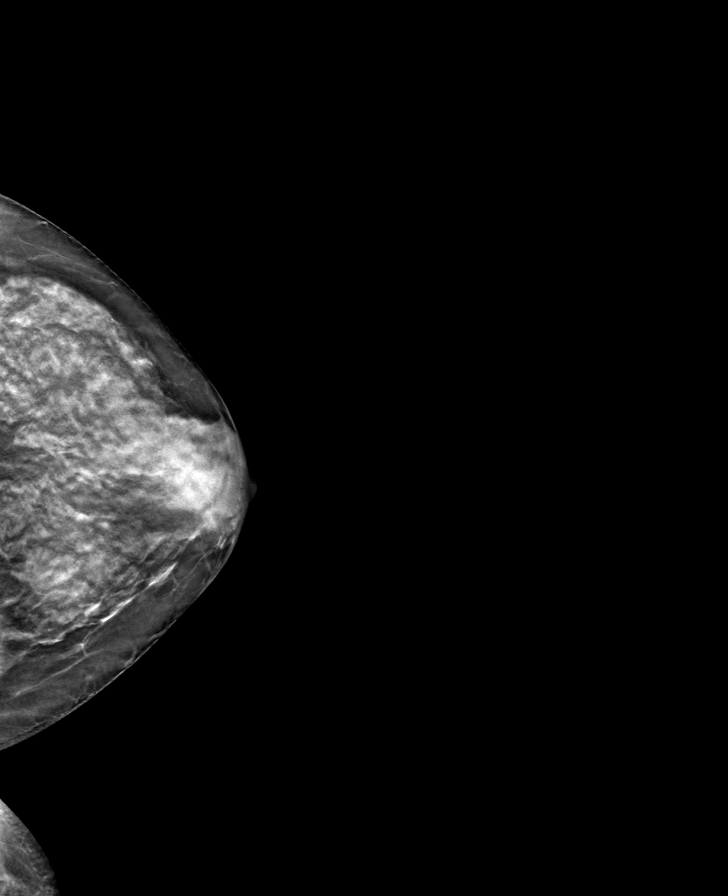

[L MLO tomo · tomo slice 29/57.0]
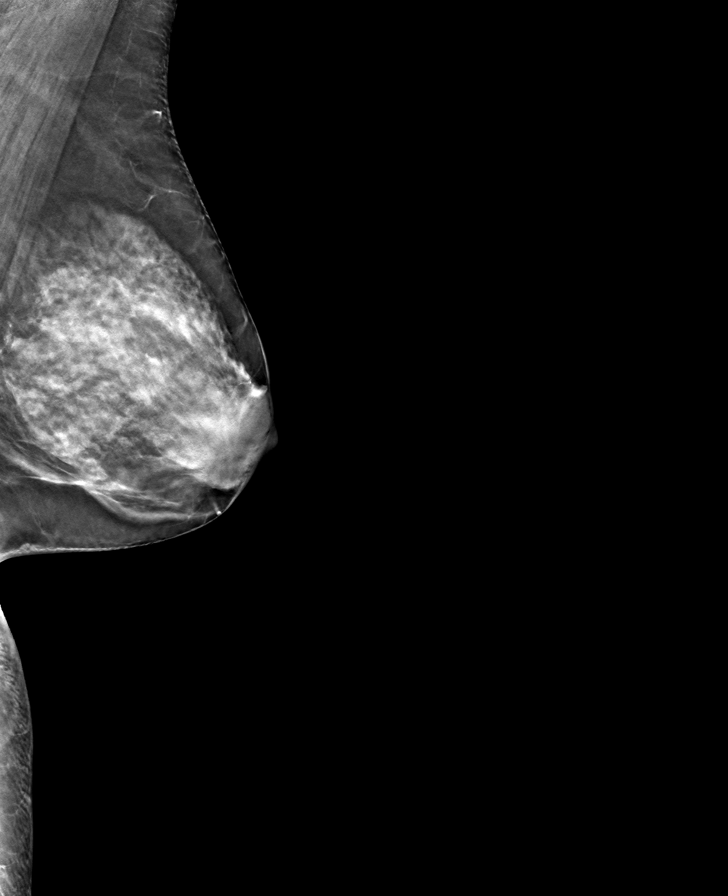

[R MLO tomo · tomo slice 30/59.0]
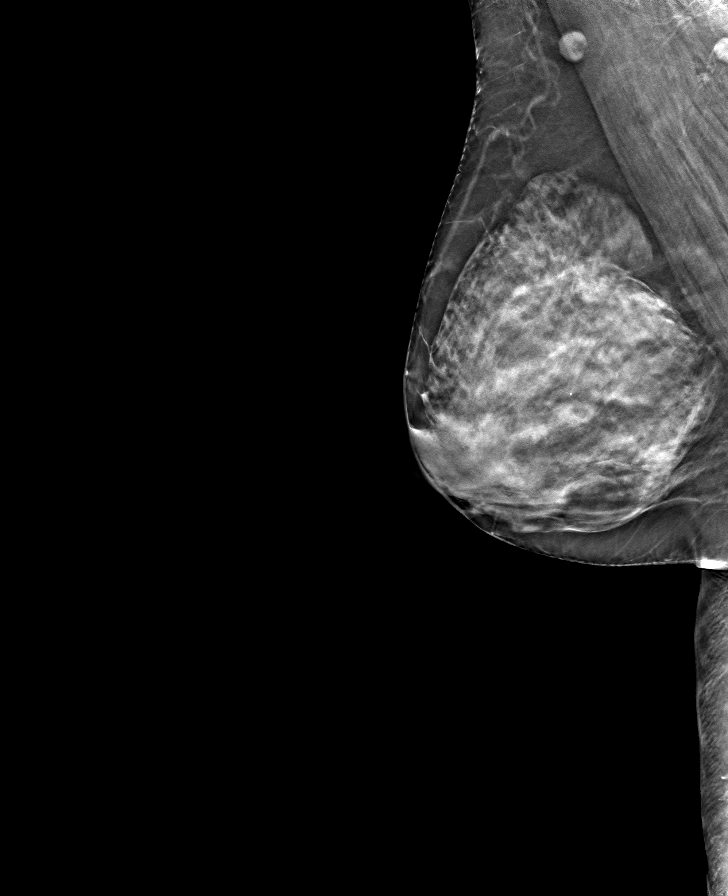

[8 of 24 positions shown; findings below may reference images not displayed]

ACR Breast Density Category d: The breast tissue is extremely dense,
which lowers the sensitivity of mammography
FINDINGS: There are no findings suspicious for malignancy.
IMPRESSION: No mammographic evidence of malignancy. A result letter of this
screening mammogram will be mailed directly to the patient.

RECOMMENDATION:
Screening mammogram in one year. (Code:TA-V-WV9)

BI-RADS CATEGORY  1: Negative.

## 2022-10-19 ENCOUNTER — Ambulatory Visit: Payer: 59 | Admitting: Medical-Surgical

## 2023-01-17 ENCOUNTER — Other Ambulatory Visit: Payer: Self-pay

## 2023-01-17 ENCOUNTER — Encounter (HOSPITAL_BASED_OUTPATIENT_CLINIC_OR_DEPARTMENT_OTHER): Payer: Self-pay | Admitting: Emergency Medicine

## 2023-01-17 ENCOUNTER — Emergency Department (HOSPITAL_BASED_OUTPATIENT_CLINIC_OR_DEPARTMENT_OTHER)
Admission: EM | Admit: 2023-01-17 | Discharge: 2023-01-17 | Disposition: A | Discharge status: Home / Self Care | Payer: Medicare HMO | Attending: Emergency Medicine | Admitting: Emergency Medicine

## 2023-01-17 DIAGNOSIS — F419 Anxiety disorder, unspecified: Secondary | ICD-10-CM | POA: Insufficient documentation

## 2023-01-17 DIAGNOSIS — I159 Secondary hypertension, unspecified: Secondary | ICD-10-CM | POA: Diagnosis not present

## 2023-01-17 DIAGNOSIS — I10 Essential (primary) hypertension: Secondary | ICD-10-CM | POA: Diagnosis present

## 2023-01-17 DIAGNOSIS — Z79899 Other long term (current) drug therapy: Secondary | ICD-10-CM | POA: Diagnosis not present

## 2023-01-17 NOTE — ED Provider Notes (Signed)
[facility] Provider Note   CSN: [id number] Arrival date & time: 01/17/23  1021     History  Chief Complaint  Patient presents with   Hypertension    Annette Paul is a 67 y.o. female.  Patient here due to high blood pressure.  She has a history of high blood pressure but has not been taking her blood pressure meds.  She is anxious about her blood pressure.  She denies any chest pain or stroke symptoms.  No headache weakness or numbness or tingling.  She follows with primary care doctor.  She started taking her blood pressure medicine yesterday.  She has been resistant about taking medicine.  The history is provided by the patient.       Home Medications Prior to Admission medications   Medication Sig Start Date End Date Taking? Authorizing Provider  ALPRAZolam Duanne Moron) 0.5 MG tablet Take 1 tablet (0.5 mg total) by mouth 2 (two) times daily as needed for anxiety. 06/01/22   Samuel Bouche, NP  cyclobenzaprine (FLEXERIL) 10 MG tablet Take 0.5-1 tablets (5-10 mg total) by mouth 3 (three) times daily as needed for muscle spasms. Caution: can cause drowsiness 06/10/22   Samuel Bouche, NP  famotidine (PEPCID) 40 MG tablet Take 0.5 tablets (20 mg total) by mouth 2 (two) times daily as needed. Patient not taking: Reported on 07/06/2022 04/13/22   Samuel Bouche, NP  FLUoxetine (PROZAC) 20 MG capsule Take 1 capsule (20 mg total) by mouth daily. Patient not taking: Reported on 07/06/2022 06/01/22   Samuel Bouche, NP  meloxicam (MOBIC) 15 MG tablet Take 15 mg by mouth daily. Patient not taking: Reported on 07/06/2022 03/03/22   [provider]      Allergies    Codeine, Ace inhibitors, and Sulfa antibiotics    Review of Systems   Review of Systems  Physical Exam Updated Vital Signs BP (!) 163/96 (BP Location: Right Arm)   Pulse 87   Temp 98.5 F (36.9 C) (Oral)   Resp 18   Ht '5\' 2"'$  (1.575 m)   Wt 64.4 kg   SpO2 97%   BMI 25.97 kg/m   Physical Exam Vitals and nursing note reviewed.  Constitutional:      General: She is not in acute distress.    Appearance: She is well-developed. She is not ill-appearing.  HENT:     Head: Normocephalic and atraumatic.     Nose: Nose normal.     Mouth/Throat:     Mouth: Mucous membranes are moist.  Eyes:     Extraocular Movements: Extraocular movements intact.     Conjunctiva/sclera: Conjunctivae normal.     Pupils: Pupils are equal, round, and reactive to light.  Cardiovascular:     Rate and Rhythm: Normal rate and regular rhythm.     Pulses: Normal pulses.     Heart sounds: Normal heart sounds. No murmur heard. Pulmonary:     Effort: Pulmonary effort is normal. No respiratory distress.     Breath sounds: Normal breath sounds.  Abdominal:     Palpations: Abdomen is soft.     Tenderness: There is no abdominal tenderness.  Musculoskeletal:        General: No swelling or tenderness. Normal range of motion.     Cervical back: Normal range of motion and neck supple.  Skin:    General: Skin is warm and dry.     Capillary Refill: Capillary refill takes less than 2 seconds.  Neurological:     General: No focal deficit present.     Mental Status: She is alert and oriented to person, place, and time.     Cranial Nerves: No cranial nerve deficit.     Sensory: No sensory deficit.     Motor: No weakness.     Coordination: Coordination normal.     Comments: 5+ out of 5 strength throughout, normal sensation, no drift, normal finger-nose-finger, normal speech  Psychiatric:        Mood and Affect: Mood normal.     ED Results / Procedures / Treatments   Labs (all labs ordered are listed, but only abnormal results are displayed) Labs Reviewed - No data to display  EKG EKG Interpretation  Date/Time:  Sunday January 17 2023 10:40:11 EST Ventricular Rate:  91 PR Interval:  117 QRS Duration: 89 QT Interval:  331 QTC Calculation: 408 R Axis:   62 Text Interpretation: Sinus  rhythm Borderline short PR interval Biatrial enlargement Abnormal R-wave progression, early transition Confirmed by Ronnald Nian, Asaph Serena ([id number]) on 01/17/2023 10:42:47 AM  Radiology No results found.  Procedures Procedures    Medications Ordered in ED Medications - No data to display  ED Course/ Medical Decision Making/ A&P                             Medical Decision Making  Annette Paul is here for high blood pressure.  Blood pressure 163/96.  She has a history of high blood pressure but she admits she has not been taking her blood pressure medication.  Per chart review it sounds like she has a history of noncompliance the last few months that she was started on blood pressure medicine a few months ago.  She states that she has a medication and refills.  Blood pressure has been elevated at home greater than 180.  She has no stroke symptoms and no chest pain.  Overall she is asymptomatic.  She is anxious and does not really want to take medications but strongly encouraged her to start taking her prescribed blood pressure medicine.  She had an EKG done per my review and interpretation shows sinus rhythm.  No ischemic changes.  I recommend that she continue to take her blood pressure medicine and continue to track blood pressure at home follow-up with primary care doctor.  She understands return precautions.  This chart was dictated using voice recognition software.  Despite best efforts to proofread,  errors can occur which can change the documentation meaning.         Final Clinical Impression(s) / ED Diagnoses Final diagnoses:  Secondary hypertension    Rx / DC Orders ED Discharge Orders     None         Lennice Sites, DO 01/17/23 1100

## 2023-01-17 NOTE — ED Notes (Signed)
ED Provider at bedside. 

## 2023-01-17 NOTE — ED Triage Notes (Addendum)
Patient c/o elevated blood pressure for past couple of days. Pt reports having epigastric pain that started last night.

## 2023-01-18 ENCOUNTER — Telehealth: Payer: Self-pay | Admitting: General Practice

## 2023-01-18 NOTE — Transitions of Care (Post Inpatient/ED Visit) (Signed)
   01/18/2023  Name: NOLAN SANDEFER MRN: [id number] DOB: 1955/11/25  Today's TOC FU Call Status: Today's TOC FU Call Status:: Successful TOC FU Call Competed TOC FU Call Complete Date: 01/18/23  Transition Care Management Follow-up Telephone Call  Patient stated that she has a different PCP due to insurance.   SIGNATURE: Tinnie Gens, RN BSN

## 2023-03-08 ENCOUNTER — Encounter: Payer: Self-pay | Admitting: *Deleted

## 2024-07-25 ENCOUNTER — Encounter: Payer: Self-pay | Admitting: Sports Medicine
